# Patient Record
Sex: Female | Born: 1965 | Race: Black or African American | Hispanic: No | Marital: Single | Smoking: Never smoker
Health system: Southern US, Community
[De-identification: ages and names within clinical notes are randomized; demographics above are authoritative.]

## PROBLEM LIST (undated history)

## (undated) DIAGNOSIS — I10 Essential (primary) hypertension: Secondary | ICD-10-CM

## (undated) DIAGNOSIS — E119 Type 2 diabetes mellitus without complications: Secondary | ICD-10-CM

## (undated) DIAGNOSIS — K219 Gastro-esophageal reflux disease without esophagitis: Secondary | ICD-10-CM

## (undated) DIAGNOSIS — G4730 Sleep apnea, unspecified: Secondary | ICD-10-CM

## (undated) HISTORY — DX: Sleep apnea, unspecified: G47.30

## (undated) HISTORY — PX: WISDOM TOOTH EXTRACTION: SHX21

## (undated) HISTORY — DX: Gastro-esophageal reflux disease without esophagitis: K21.9

---

## 1994-02-06 HISTORY — PX: TUBAL LIGATION: SHX77

## 1997-06-03 ENCOUNTER — Other Ambulatory Visit: Admission: RE | Admit: 1997-06-03 | Discharge: 1997-06-03 | Payer: Self-pay | Admitting: Obstetrics

## 1999-06-07 ENCOUNTER — Other Ambulatory Visit: Admission: RE | Admit: 1999-06-07 | Discharge: 1999-06-07 | Payer: Self-pay | Admitting: Family Medicine

## 1999-06-07 ENCOUNTER — Ambulatory Visit (HOSPITAL_COMMUNITY): Admission: RE | Admit: 1999-06-07 | Discharge: 1999-06-07 | Payer: Self-pay | Admitting: Family Medicine

## 1999-12-17 ENCOUNTER — Emergency Department (HOSPITAL_COMMUNITY): Admission: EM | Admit: 1999-12-17 | Discharge: 1999-12-17 | Payer: Self-pay | Admitting: *Deleted

## 2000-03-02 ENCOUNTER — Encounter: Payer: Self-pay | Admitting: Emergency Medicine

## 2000-03-02 ENCOUNTER — Emergency Department (HOSPITAL_COMMUNITY): Admission: EM | Admit: 2000-03-02 | Discharge: 2000-03-02 | Payer: Self-pay | Admitting: Emergency Medicine

## 2002-02-26 ENCOUNTER — Other Ambulatory Visit: Admission: RE | Admit: 2002-02-26 | Discharge: 2002-02-26 | Payer: Self-pay | Admitting: Family Medicine

## 2003-08-25 ENCOUNTER — Emergency Department (HOSPITAL_COMMUNITY): Admission: EM | Admit: 2003-08-25 | Discharge: 2003-08-26 | Payer: Self-pay | Admitting: Emergency Medicine

## 2003-08-31 ENCOUNTER — Emergency Department (HOSPITAL_COMMUNITY): Admission: EM | Admit: 2003-08-31 | Discharge: 2003-08-31 | Payer: Self-pay | Admitting: Family Medicine

## 2005-09-06 ENCOUNTER — Emergency Department (HOSPITAL_COMMUNITY): Admission: EM | Admit: 2005-09-06 | Discharge: 2005-09-06 | Payer: Self-pay | Admitting: Family Medicine

## 2006-08-29 ENCOUNTER — Other Ambulatory Visit: Admission: RE | Admit: 2006-08-29 | Discharge: 2006-08-29 | Payer: Self-pay | Admitting: Family Medicine

## 2006-09-12 ENCOUNTER — Encounter: Admission: RE | Admit: 2006-09-12 | Discharge: 2006-09-12 | Payer: Self-pay | Admitting: Family Medicine

## 2007-02-03 ENCOUNTER — Emergency Department (HOSPITAL_COMMUNITY): Admission: EM | Admit: 2007-02-03 | Discharge: 2007-02-03 | Payer: Self-pay | Admitting: Emergency Medicine

## 2007-02-04 ENCOUNTER — Encounter (INDEPENDENT_AMBULATORY_CARE_PROVIDER_SITE_OTHER): Payer: Self-pay | Admitting: Emergency Medicine

## 2007-02-04 ENCOUNTER — Ambulatory Visit: Payer: Self-pay | Admitting: Vascular Surgery

## 2007-02-04 ENCOUNTER — Ambulatory Visit (HOSPITAL_COMMUNITY): Admission: RE | Admit: 2007-02-04 | Discharge: 2007-02-04 | Payer: Self-pay | Admitting: Emergency Medicine

## 2007-09-11 ENCOUNTER — Emergency Department (HOSPITAL_COMMUNITY): Admission: EM | Admit: 2007-09-11 | Discharge: 2007-09-11 | Payer: Self-pay | Admitting: Emergency Medicine

## 2010-11-04 LAB — CULTURE, ROUTINE-ABSCESS

## 2010-11-11 LAB — D-DIMER, QUANTITATIVE: D-Dimer, Quant: 1.57 — ABNORMAL HIGH

## 2012-10-24 ENCOUNTER — Encounter (HOSPITAL_COMMUNITY): Payer: Self-pay | Admitting: *Deleted

## 2012-10-24 ENCOUNTER — Emergency Department (INDEPENDENT_AMBULATORY_CARE_PROVIDER_SITE_OTHER)
Admission: EM | Admit: 2012-10-24 | Discharge: 2012-10-24 | Disposition: A | Discharge status: Home / Self Care | Payer: BC Managed Care – PPO | Source: Home / Self Care | Attending: Emergency Medicine | Admitting: Emergency Medicine

## 2012-10-24 DIAGNOSIS — M436 Torticollis: Secondary | ICD-10-CM

## 2012-10-24 MED ORDER — MELOXICAM 15 MG PO TABS: 15.0000 mg | ORAL_TABLET | Freq: Every day | ORAL | Status: DC

## 2012-10-24 MED ORDER — CYCLOBENZAPRINE HCL 5 MG PO TABS: 5.0000 mg | ORAL_TABLET | Freq: Three times a day (TID) | ORAL | Status: DC | PRN

## 2012-10-24 NOTE — ED Notes (Signed)
Pt  Reports she woke  Up  sev  Days  Ago  With a  painfull  Stiff  Neck  -  She  denys  Any specefic injury  She  Attributes  It to  Sleeping on it  Wrong           She  ambulated to  Room with a  Steady   Fluid  Gait       Sitting upright on  Exam table  Speaking in  Complete  sentances

## 2012-10-24 NOTE — ED Provider Notes (Signed)
Chief Complaint:   Chief Complaint  Patient presents with  . Torticollis    History of Present Illness:   Laurie Buckley is a 47 year old female who woke up Monday morning, 4 days ago, with pain in the left side of her neck along the trapezius, radiating into her upper back and towards her shoulder but not down her arm. The pain was worse with lateral bending or rotation of the head to the right but not to the left. She denies any radiation down the arm, numbness, tingling, weakness, or arm swelling. She's had no fever, headache, neck mass, or adenopathy. She denies any trauma to the neck in the past. She has had no chest pain or shortness of breath.  Review of Systems:  Other than noted above, the patient denies any of the following symptoms: Constitutional:  No fever, chills, or sweats. ENT:  No nasal congestion, sore throat, or oral ulcerations or lesions. Neck:  No swelling, or adenopathy.  Full ROM without pain. Cardiac:  No chest pain, tightness, or pressure. Respiratory:  No cough, wheezing, or dyspnea. M-S:  No joint pain, muscle pain, or back problems. Neuro:  No muscle weakness, numbness or paresthesias.  PMFSH:  Past medical history, family history, social history, meds, and allergies were reviewed.  She has high blood pressure and takes lisinopril.  Physical Exam:   Vital signs:  BP 152/100  Pulse 80  Temp(Src) 98.6 F (37 C) (Oral)  Resp 18  SpO2 98%  LMP 09/06/2012 General:  Alert, oriented and in no distress. Eye:  PERRL, full EOMs. ENT:  Pharynx clear, no oral lesions. Neck:  She has pain to palpation over her left trapezius ridge extending into her upper back area. There was no adenopathy or mass. The neck has a limited range of motion with decrease in right lateral bending and right rotation with pain. She has a fairly good range of motion to the left. The shoulder was nontender and have full range of motion. Lungs:  No respiratory distress.  Breath sounds clear and  equal bilaterally.  No wheezes, rales or rhonchi. Heart:  Regular rhythm.  No gallops, murmers, or rubs. Ext:  No upper extremity edema, pulses full.  Full ROM of joints with no joint or muscle pain to palpation. Neuro:  Alert and oriented times 3.  No focal muscle weakness.  DTRs symmetric.  Sensation intact to light touch. Skin: Clear, warm and dry.  No rash.  Good capillary refill.  Assessment:  The encounter diagnosis was Torticollis.  No evidence of cervical radiculopathy. Patient advised that should clear up within the next week to 10 days, if not return here for recheck. She was advised of her elevated blood pressure and suggested she return to her primary care physician for recheck on this.  Plan:   1.  Meds:  The following meds were prescribed:   Discharge Medication List as of 10/24/2012  8:46 AM    START taking these medications   Details  cyclobenzaprine (FLEXERIL) 5 MG tablet Take 1 tablet (5 mg total) by mouth 3 (three) times daily as needed for muscle spasms., Starting 10/24/2012, Until Discontinued, Normal    meloxicam (MOBIC) 15 MG tablet Take 1 tablet (15 mg total) by mouth daily., Starting 10/24/2012, Until Discontinued, Normal        2.  Patient Education/Counseling:  The patient was given appropriate handouts, self care instructions, and instructed in symptomatic relief.  Given neck exercises to do.  3.  Follow up:  The patient was told to follow up if no better in 3 to 4 days, if becoming worse in any way, and given some red flag symptoms such as worsening pain or new neurological symptoms which would prompt immediate return.  Follow up here if necessary.     Reuben Likes, MD 10/24/12 [phone]

## 2012-11-07 ENCOUNTER — Other Ambulatory Visit: Payer: Self-pay

## 2013-01-24 ENCOUNTER — Other Ambulatory Visit: Payer: Self-pay

## 2013-01-24 DIAGNOSIS — Z1231 Encounter for screening mammogram for malignant neoplasm of breast: Secondary | ICD-10-CM

## 2013-01-28 ENCOUNTER — Ambulatory Visit
Admission: RE | Admit: 2013-01-28 | Discharge: 2013-01-28 | Disposition: A | Discharge status: Home / Self Care | Payer: BC Managed Care – PPO | Source: Ambulatory Visit

## 2013-01-28 DIAGNOSIS — Z1231 Encounter for screening mammogram for malignant neoplasm of breast: Secondary | ICD-10-CM

## 2014-02-23 ENCOUNTER — Encounter (HOSPITAL_COMMUNITY): Payer: Self-pay

## 2014-02-23 ENCOUNTER — Emergency Department (HOSPITAL_COMMUNITY)
Admission: EM | Admit: 2014-02-23 | Discharge: 2014-02-23 | Disposition: A | Discharge status: Home / Self Care | Payer: Medicaid Other | Source: Home / Self Care | Attending: Emergency Medicine | Admitting: Emergency Medicine

## 2014-02-23 DIAGNOSIS — H6092 Unspecified otitis externa, left ear: Secondary | ICD-10-CM

## 2014-02-23 MED ORDER — CIPROFLOXACIN-DEXAMETHASONE 0.3-0.1 % OT SUSP
4.0000 [drp] | Freq: Two times a day (BID) | OTIC | Status: DC
Start: 2014-02-23 — End: 2020-03-10

## 2014-02-23 NOTE — ED Provider Notes (Signed)
CSN: [id number]     Arrival date & time 02/23/14  1455 History   First MD Initiated Contact with Patient 02/23/14 1502     Chief Complaint  Patient presents with  . Otalgia   (Consider location/radiation/quality/duration/timing/severity/associated sxs/prior Treatment) HPI  She is a 49 year old woman here for evaluation of left ear pain. This started about 4 days ago. It is described as a sharp ear pain. It is not associated with jaw movements. No decrease in hearing. She does state the ear feels congested. She has tried white vinegar without improvement. She also took some of her daughter's amoxicillin from an old prescription without improvement.  No fevers or chills. No nasal congestion or rhinorrhea.  History reviewed. No pertinent past medical history. History reviewed. No pertinent past surgical history. History reviewed. No pertinent family history. History  Substance Use Topics  . Smoking status: Never Smoker   . Smokeless tobacco: Not on file  . Alcohol Use: Not on file   OB History    No data available     Review of Systems As in history of present illness Allergies  Review of patient's allergies indicates no known allergies.  Home Medications   Prior to Admission medications   Medication Sig Start Date End Date Taking? Authorizing Provider  ciprofloxacin-dexamethasone (CIPRODEX) otic suspension Place 4 drops into the left ear 2 (two) times daily. 02/23/14   Melony Overly, MD  cyclobenzaprine (FLEXERIL) 5 MG tablet Take 1 tablet (5 mg total) by mouth 3 (three) times daily as needed for muscle spasms. 10/24/12   Harden Mo, MD  meloxicam (MOBIC) 15 MG tablet Take 1 tablet (15 mg total) by mouth daily. 10/24/12   Harden Mo, MD   BP 158/99 mmHg  Pulse 109  Temp(Src) 98.2 F (36.8 C) (Oral)  Resp 16  SpO2 99% Physical Exam  Constitutional: She is oriented to person, place, and time. She appears well-developed and well-nourished. No distress.  HENT:  Right Ear:  Tympanic membrane, external ear and ear canal normal.  Left Ear: Tympanic membrane normal. There is tenderness (and erythema of ear canal).  Mouth/Throat: Oropharynx is clear and moist. No oropharyngeal exudate.  Cardiovascular: Normal rate.   Pulmonary/Chest: Effort normal.  Neurological: She is alert and oriented to person, place, and time.    ED Course  Procedures (including critical care time) Labs Review Labs Reviewed - No data to display  Imaging Review No results found.   MDM   1. Left otitis externa    We'll treat with Ciprodex for 1 week. Follow-up as needed.    Melony Overly, MD 02/23/14 [phone]

## 2014-02-23 NOTE — Discharge Instructions (Signed)
You have some irritation of the ear canal. Use the ear drops twice a day for 7 days. You can take ibuprofen 600mg  4 times a day as needed for pain.  Follow up if no improvement in 3 days.

## 2014-02-23 NOTE — ED Notes (Signed)
C/o pain in her left ear x couple of days. Unable to get relief w home remedies , used left over amoxicillin from her daughters previous ear infection

## 2014-03-27 ENCOUNTER — Other Ambulatory Visit: Payer: Self-pay

## 2014-03-27 DIAGNOSIS — Z1231 Encounter for screening mammogram for malignant neoplasm of breast: Secondary | ICD-10-CM

## 2014-03-31 ENCOUNTER — Other Ambulatory Visit: Payer: Self-pay

## 2014-03-31 ENCOUNTER — Ambulatory Visit
Admission: RE | Admit: 2014-03-31 | Discharge: 2014-03-31 | Disposition: A | Discharge status: Home / Self Care | Payer: Medicaid Other | Source: Ambulatory Visit

## 2014-03-31 DIAGNOSIS — Z1231 Encounter for screening mammogram for malignant neoplasm of breast: Secondary | ICD-10-CM

## 2014-05-08 ENCOUNTER — Ambulatory Visit (HOSPITAL_BASED_OUTPATIENT_CLINIC_OR_DEPARTMENT_OTHER): Payer: 59 | Attending: Internal Medicine

## 2014-05-08 VITALS — Ht 64.0 in | Wt 240.0 lb

## 2014-05-08 DIAGNOSIS — R0683 Snoring: Secondary | ICD-10-CM | POA: Diagnosis not present

## 2014-05-08 DIAGNOSIS — G4710 Hypersomnia, unspecified: Secondary | ICD-10-CM | POA: Diagnosis present

## 2014-05-08 DIAGNOSIS — G4733 Obstructive sleep apnea (adult) (pediatric): Secondary | ICD-10-CM | POA: Insufficient documentation

## 2014-05-08 DIAGNOSIS — G4730 Sleep apnea, unspecified: Secondary | ICD-10-CM

## 2014-05-10 ENCOUNTER — Ambulatory Visit (HOSPITAL_BASED_OUTPATIENT_CLINIC_OR_DEPARTMENT_OTHER): Payer: 59 | Admitting: Internal Medicine

## 2014-05-10 DIAGNOSIS — G4733 Obstructive sleep apnea (adult) (pediatric): Secondary | ICD-10-CM

## 2014-05-10 NOTE — Sleep Study (Signed)
   NAME: Laurie Buckley DATE OF BIRTH:  1965/12/15 MEDICAL RECORD NUMBER [id number]  LOCATION: [facility]  PHYSICIAN: Naria Abbey D  DATE OF STUDY: 05/08/2014  SLEEP STUDY TYPE: Nocturnal Polysomnogram               REFERRING PHYSICIAN: Nolene Ebbs, MD  INDICATION FOR STUDY: Hypersomnia with sleep apnea  EPWORTH SLEEPINESS SCORE:   4/24 HEIGHT: 5\' 4"  (162.6 cm)  WEIGHT: 108.863 kg (240 lb)    Body mass index is 41.18 kg/(m^2).  NECK SIZE: 15 in.  MEDICATIONS: Charted for review  SLEEP ARCHITECTURE: Split study protocol. During the diagnostic phase, total sleep time 169.5 minutes with sleep efficiency 92.9%. Stage I was 2.9%, stage II 88.5%, stage III 1.5%, REM 7.1% of total sleep time. Sleep latency 10.5 minutes, REM latency 114.5 minutes, awake after sleep onset 3.5 minutes, arousal index 2.5, bedtime medication: None  RESPIRATORY DATA: Apnea hypopnea index (AHI) 55.2 per hour. 156 total events scored including 73 obstructive apneas and 83 hypopneas. Events were while supine. REM AHI 120 per hour. CPAP was titrated to 14 CWP, AHI 0.7 per hour. She wore a medium fullface mask.  OXYGEN DATA: Loud snoring before CPAP with oxygen desaturation to a nadir of 73% on room air. With CPAP control, snoring was prevented and mean oxygen saturation was 95.1%.  CARDIAC DATA: Sinus rhythm with PVCs  MOVEMENT/PARASOMNIA: No significant movement disturbance, bathroom 2  IMPRESSION/ RECOMMENDATION:   1) Severe obstructive sleep apnea/hypopnea syndrome, AHI 55.2 per hour with supine events. Loud snoring with oxygen desaturation to a nadir of 73% on room air. 2) Successful CPAP titration to 14 CWP, AHI 0.7 per hour. She wore a medium F&P Simplus fullface mask with heated humidifier. Snoring was prevented and mean oxygen saturation was 95.1%   Deneise Lever Diplomate, American Board of Sleep Medicine  ELECTRONICALLY SIGNED ON:  05/10/2014, 2:59 PM [place] PH: [phone]   FX: [phone] [place]

## 2014-06-10 ENCOUNTER — Encounter (HOSPITAL_BASED_OUTPATIENT_CLINIC_OR_DEPARTMENT_OTHER): Payer: Medicaid Other

## 2015-04-21 ENCOUNTER — Emergency Department (HOSPITAL_COMMUNITY)
Admission: EM | Admit: 2015-04-21 | Discharge: 2015-04-21 | Disposition: A | Discharge status: Home / Self Care | Payer: BLUE CROSS/BLUE SHIELD | Source: Home / Self Care | Attending: Family Medicine | Admitting: Family Medicine

## 2015-04-21 ENCOUNTER — Emergency Department (INDEPENDENT_AMBULATORY_CARE_PROVIDER_SITE_OTHER): Payer: BLUE CROSS/BLUE SHIELD

## 2015-04-21 ENCOUNTER — Encounter (HOSPITAL_COMMUNITY): Payer: Self-pay | Admitting: *Deleted

## 2015-04-21 DIAGNOSIS — J209 Acute bronchitis, unspecified: Secondary | ICD-10-CM | POA: Diagnosis not present

## 2015-04-21 HISTORY — DX: Essential (primary) hypertension: I10

## 2015-04-21 MED ORDER — METHYLPREDNISOLONE ACETATE 40 MG/ML IJ SUSP
80.0000 mg | Freq: Once | INTRAMUSCULAR | Status: AC
  Administered 2015-04-21: 80 mg via INTRAMUSCULAR

## 2015-04-21 MED ORDER — TRIAMCINOLONE ACETONIDE 40 MG/ML IJ SUSP
INTRAMUSCULAR | Status: AC
  Filled 2015-04-21: qty 1

## 2015-04-21 MED ORDER — ACETAMINOPHEN 325 MG PO TABS
650.0000 mg | ORAL_TABLET | Freq: Once | ORAL | Status: AC
  Administered 2015-04-21: 650 mg via ORAL

## 2015-04-21 MED ORDER — TRIAMCINOLONE ACETONIDE 40 MG/ML IJ SUSP
40.0000 mg | Freq: Once | INTRAMUSCULAR | Status: AC
  Administered 2015-04-21: 40 mg via INTRAMUSCULAR

## 2015-04-21 MED ORDER — AZITHROMYCIN 250 MG PO TABS: ORAL_TABLET | ORAL | Status: DC

## 2015-04-21 MED ORDER — ACETAMINOPHEN 325 MG PO TABS
ORAL_TABLET | ORAL | Status: AC
  Filled 2015-04-21: qty 2

## 2015-04-21 MED ORDER — METHYLPREDNISOLONE ACETATE 80 MG/ML IJ SUSP
INTRAMUSCULAR | Status: AC
  Filled 2015-04-21: qty 1

## 2015-04-21 NOTE — Discharge Instructions (Signed)
Take all of medicine, drink lots of fluids, see your doctor if further problems °

## 2015-04-21 NOTE — ED Notes (Signed)
Pt     Reports symptoms  Of    Cough   Congested      Chills    Body aches  And  Fever     With  Onset  of  Symptoms        X   2  Days    daughter  Hs  Similar   Symptoms  As  Well

## 2015-04-21 NOTE — ED Provider Notes (Signed)
CSN: [id number]     Arrival date & time 04/21/15  1602 History   First MD Initiated Contact with Patient 04/21/15 1703     Chief Complaint  Patient presents with  . URI   (Consider location/radiation/quality/duration/timing/severity/associated sxs/prior Treatment) Patient is a 50 y.o. female presenting with URI. The history is provided by the patient.  URI Presenting symptoms: congestion, cough, fatigue, fever and rhinorrhea   Severity:  Moderate Onset quality:  Sudden Duration:  2 days Chronicity:  New Ineffective treatments:  OTC medications Associated symptoms: myalgias and wheezing   Risk factors: sick contacts   Risk factors comment:  Daughter also sick.   Past Medical History  Diagnosis Date  . Hypertension    History reviewed. No pertinent past surgical history. History reviewed. No pertinent family history. Social History  Substance Use Topics  . Smoking status: Never Smoker   . Smokeless tobacco: None  . Alcohol Use: No   OB History    No data available     Review of Systems  Constitutional: Positive for fever and fatigue.  HENT: Positive for congestion, postnasal drip and rhinorrhea.   Respiratory: Positive for cough and wheezing. Negative for shortness of breath.   Gastrointestinal: Negative.   Genitourinary: Negative.   Musculoskeletal: Positive for myalgias.  All other systems reviewed and are negative.   Allergies  Review of patient's allergies indicates no known allergies.  Home Medications   Prior to Admission medications   Medication Sig Start Date End Date Taking? Authorizing Provider  LISINOPRIL PO Take by mouth.   Yes Historical Provider, MD  ciprofloxacin-dexamethasone (CIPRODEX) otic suspension Place 4 drops into the left ear 2 (two) times daily. 02/23/14   Melony Overly, MD  cyclobenzaprine (FLEXERIL) 5 MG tablet Take 1 tablet (5 mg total) by mouth 3 (three) times daily as needed for muscle spasms. 10/24/12   Harden Mo, MD  meloxicam  (MOBIC) 15 MG tablet Take 1 tablet (15 mg total) by mouth daily. 10/24/12   Harden Mo, MD   Meds Ordered and Administered this Visit  Medications - No data to display  BP 137/80 mmHg  Pulse 87  Temp(Src) 102.1 F (38.9 C) (Oral)  Resp 16  SpO2 96%  LMP 04/21/2015 No data found.   Physical Exam  Constitutional: She is oriented to person, place, and time. She appears well-developed and well-nourished. No distress.  HENT:  Right Ear: External ear normal.  Left Ear: External ear normal.  Mouth/Throat: Oropharynx is clear and moist.  Neck: Normal range of motion. Neck supple.  Cardiovascular: Normal rate, regular rhythm, normal heart sounds and intact distal pulses.   Pulmonary/Chest: Effort normal.  Abdominal: Soft. Bowel sounds are normal. There is no tenderness.  Lymphadenopathy:    She has no cervical adenopathy.  Neurological: She is alert and oriented to person, place, and time.  Skin: Skin is warm and dry.  Nursing note and vitals reviewed.   ED Course  Procedures (including critical care time)  Labs Review Labs Reviewed - No data to display  Imaging Review No results found. Meds ordered this encounter  Medications  . LISINOPRIL PO    Sig: Take by mouth.  Marland Kitchen acetaminophen (TYLENOL) tablet 650 mg    Sig:      Visual Acuity Review  Right Eye Distance:   Left Eye Distance:   Bilateral Distance:    Right Eye Near:   Left Eye Near:    Bilateral Near:  MDM  No diagnosis found. Meds ordered this encounter  Medications  . LISINOPRIL PO    Sig: Take by mouth.  Marland Kitchen acetaminophen (TYLENOL) tablet 650 mg    Sig:   . azithromycin (ZITHROMAX Z-PAK) 250 MG tablet    Sig: Take as directed on pack    Dispense:  6 tablet    Refill:  0  . triamcinolone acetonide (KENALOG-40) injection 40 mg    Sig:   . methylPREDNISolone acetate (DEPO-MEDROL) injection 80 mg    Sig:       Billy Fischer, MD 04/21/15 1850

## 2020-03-10 ENCOUNTER — Ambulatory Visit
Admission: EM | Admit: 2020-03-10 | Discharge: 2020-03-10 | Disposition: A | Discharge status: Home / Self Care | Payer: 59 | Attending: Urgent Care | Admitting: Urgent Care

## 2020-03-10 ENCOUNTER — Other Ambulatory Visit: Payer: Self-pay

## 2020-03-10 DIAGNOSIS — R350 Frequency of micturition: Secondary | ICD-10-CM | POA: Insufficient documentation

## 2020-03-10 DIAGNOSIS — N898 Other specified noninflammatory disorders of vagina: Secondary | ICD-10-CM | POA: Insufficient documentation

## 2020-03-10 DIAGNOSIS — B373 Candidiasis of vulva and vagina: Secondary | ICD-10-CM | POA: Diagnosis present

## 2020-03-10 DIAGNOSIS — B3731 Acute candidiasis of vulva and vagina: Secondary | ICD-10-CM

## 2020-03-10 DIAGNOSIS — R3989 Other symptoms and signs involving the genitourinary system: Secondary | ICD-10-CM | POA: Diagnosis present

## 2020-03-10 LAB — POCT URINALYSIS DIP (MANUAL ENTRY)
Bilirubin, UA: NEGATIVE
Blood, UA: NEGATIVE
Glucose, UA: NEGATIVE mg/dL
Nitrite, UA: NEGATIVE
Protein Ur, POC: 100 mg/dL — AB
Spec Grav, UA: 1.025 (ref 1.010–1.025)
Urobilinogen, UA: 0.2 E.U./dL — AB
pH, UA: 7 (ref 5.0–8.0)

## 2020-03-10 MED ORDER — FLUCONAZOLE 150 MG PO TABS: 150.0000 mg | ORAL_TABLET | ORAL | 0 refills | Status: DC

## 2020-03-10 NOTE — ED Provider Notes (Signed)
[place]   MRN: [id number] DOB: 16-Aug-1965  Subjective:   Laurie Buckley is a 55 y.o. female presenting for 1 day history of acute onset vaginal itching, irritation of the genital area.  She was having bladder pressure, urinary frequency in the past 2 weeks.  She actually tested positive for COVID-19 and underwent an antibiotic course and prednisone course which she completed.  Denies any active dysuria, fever, nausea, vomiting, flank pain.  Admits that she has not been drinking water, has been doing Powerade and Gatorade.  Denies history of kidney stones.  Patient has low concern for STIs but is not opposed to testing.  No current facility-administered medications for this encounter. No current outpatient medications on file.   No Known Allergies  Past Medical History:  Diagnosis Date  . Hypertension      History reviewed. No pertinent surgical history.  History reviewed. No pertinent family history.  Social History   Tobacco Use  . Smoking status: Never Smoker  . Smokeless tobacco: Never Used  Vaping Use  . Vaping Use: Never used  Substance Use Topics  . Alcohol use: No    ROS   Objective:   Vitals: BP (!) 159/102 (BP Location: Right Arm)   Pulse 86   Temp 98.2 F (36.8 C) (Oral)   Resp 18   SpO2 97%   BP Readings from Last 3 Encounters:  03/10/20 (!) 159/102  04/21/15 137/80  02/23/14 158/99   Physical Exam Constitutional:      General: She is not in acute distress.    Appearance: Normal appearance. She is well-developed and normal weight. She is not ill-appearing, toxic-appearing or diaphoretic.  HENT:     Head: Normocephalic and atraumatic.     Right Ear: External ear normal.     Left Ear: External ear normal.     Nose: Nose normal.     Mouth/Throat:     Mouth: Mucous membranes are moist.     Pharynx: Oropharynx is clear.  Eyes:     General: No scleral icterus.    Extraocular Movements: Extraocular movements intact.     Pupils:  Pupils are equal, round, and reactive to light.  Cardiovascular:     Rate and Rhythm: Normal rate and regular rhythm.     Heart sounds: Normal heart sounds. No murmur heard. No friction rub. No gallop.   Pulmonary:     Effort: Pulmonary effort is normal. No respiratory distress.     Breath sounds: Normal breath sounds. No stridor. No wheezing, rhonchi or rales.  Abdominal:     General: Bowel sounds are normal. There is no distension.     Palpations: Abdomen is soft. There is no mass.     Tenderness: There is no abdominal tenderness. There is no right CVA tenderness, left CVA tenderness, guarding or rebound.  Skin:    General: Skin is warm and dry.     Coloration: Skin is not pale.     Findings: No rash.  Neurological:     General: No focal deficit present.     Mental Status: She is alert and oriented to person, place, and time.  Psychiatric:        Mood and Affect: Mood normal.        Behavior: Behavior normal.        Thought Content: Thought content normal.        Judgment: Judgment normal.     Results for orders placed or performed during the hospital encounter  of 03/10/20 (from the past 24 hour(s))  POCT urinalysis dipstick     Status: Abnormal   Collection Time: 03/10/20  1:15 PM  Result Value Ref Range   Color, UA yellow yellow   Clarity, UA hazy (A) clear   Glucose, UA negative negative mg/dL   Bilirubin, UA negative negative   Ketones, POC UA trace (5) (A) negative mg/dL   Spec Grav, UA 1.025 1.010 - 1.025   Blood, UA negative negative   pH, UA 7.0 5.0 - 8.0   Protein Ur, POC =100 (A) negative mg/dL   Urobilinogen, UA 0.2 (A) 0.2 or 1.0 E.U./dL   Nitrite, UA Negative Negative   Leukocytes, UA Trace (A) Negative    Assessment and Plan :   PDMP not reviewed this encounter.  1. Yeast vaginitis   2. Vaginal itching   3. Sensation of pressure in bladder area   4. Urinary frequency     Will cover for yeast vaginitis with Diflucan.  Labs pending.  Recommended  avoiding urinary irritants, hydrating with plain water. Counseled patient on potential for adverse effects with medications prescribed/recommended today, ER and return-to-clinic precautions discussed, patient verbalized understanding.    Jaynee Eagles, [place] 03/10/20 6144

## 2020-03-10 NOTE — ED Triage Notes (Signed)
Patient states she has had bladder pressure and urinary frequency since last night. Pt is also concerned she has had a recent yeast infection and would like it to be checked.

## 2020-03-11 LAB — CERVICOVAGINAL ANCILLARY ONLY
Bacterial Vaginitis (gardnerella): POSITIVE — AB
Candida Glabrata: NEGATIVE
Candida Vaginitis: NEGATIVE
Chlamydia: NEGATIVE
Comment: NEGATIVE
Comment: NEGATIVE
Comment: NEGATIVE
Comment: NEGATIVE
Comment: NEGATIVE
Comment: NORMAL
Neisseria Gonorrhea: NEGATIVE
Trichomonas: NEGATIVE

## 2020-03-11 LAB — URINE CULTURE: Culture: NO GROWTH

## 2020-05-14 ENCOUNTER — Ambulatory Visit (INDEPENDENT_AMBULATORY_CARE_PROVIDER_SITE_OTHER): Payer: 59 | Admitting: Primary Care

## 2020-05-14 ENCOUNTER — Other Ambulatory Visit: Payer: Self-pay

## 2020-05-14 VITALS — BP 172/110 | HR 87 | Temp 97.3°F | Ht 64.0 in | Wt 269.4 lb

## 2020-05-14 DIAGNOSIS — Z23 Encounter for immunization: Secondary | ICD-10-CM | POA: Diagnosis not present

## 2020-05-14 DIAGNOSIS — Z131 Encounter for screening for diabetes mellitus: Secondary | ICD-10-CM | POA: Diagnosis not present

## 2020-05-14 DIAGNOSIS — R7303 Prediabetes: Secondary | ICD-10-CM

## 2020-05-14 DIAGNOSIS — I10 Essential (primary) hypertension: Secondary | ICD-10-CM

## 2020-05-14 DIAGNOSIS — Z7689 Persons encountering health services in other specified circumstances: Secondary | ICD-10-CM

## 2020-05-14 DIAGNOSIS — M2550 Pain in unspecified joint: Secondary | ICD-10-CM

## 2020-05-14 DIAGNOSIS — K219 Gastro-esophageal reflux disease without esophagitis: Secondary | ICD-10-CM

## 2020-05-14 LAB — POCT GLYCOSYLATED HEMOGLOBIN (HGB A1C): Hemoglobin A1C: 6.1 % — AB (ref 4.0–5.6)

## 2020-05-14 MED ORDER — AMLODIPINE BESYLATE 10 MG PO TABS: 10.0000 mg | ORAL_TABLET | Freq: Every day | ORAL | 3 refills | Status: DC

## 2020-05-14 MED ORDER — CLONIDINE HCL 0.1 MG PO TABS
0.2000 mg | ORAL_TABLET | Freq: Once | ORAL | Status: AC
  Administered 2020-05-14: 0.2 mg via ORAL

## 2020-05-14 MED ORDER — LOSARTAN POTASSIUM-HCTZ 100-25 MG PO TABS: 1.0000 | ORAL_TABLET | Freq: Every day | ORAL | 3 refills | Status: DC

## 2020-05-14 NOTE — Patient Instructions (Signed)

## 2020-05-14 NOTE — Progress Notes (Signed)
New Patient Office Visit  Subjective:  Patient ID: Laurie Buckley, female    DOB: [birth year]  Age: 55 y.o. MRN: [id number]  CC:  Chief Complaint  Patient presents with  . New Patient (Initial Visit)    OSA- diagnosed in 2016 does have CPAP wants to know if she needs another sleep study     HPI [name] 55 year old morbid abuse presents for establish care. Her main concern if she needs a new CPAP  dx with OSA in 2016 and been on the same setting and machine. Continues to snore but does not have apnea.   Past Medical History:  Diagnosis Date  . Hypertension     No past surgical history on file.  No family history on file.  Social History   Socioeconomic History  . Marital status: Single    Spouse name: Not on file  . Number of children: Not on file  . Years of education: Not on file  . Highest education level: Not on file  Occupational History  . Not on file  Tobacco Use  . Smoking status: Never Smoker  . Smokeless tobacco: Never Used  Vaping Use  . Vaping Use: Never used  Substance and Sexual Activity  . Alcohol use: No  . Drug use: Not on file  . Sexual activity: Yes  Other Topics Concern  . Not on file  Social History Narrative  . Not on file   Social Determinants of Health   Financial Resource Strain: Not on file  Food Insecurity: Not on file  Transportation Needs: Not on file  Physical Activity: Not on file  Stress: Not on file  Social Connections: Not on file  Intimate Partner Violence: Not on file    ROS Review of Systems  Cardiovascular:       Chest tightness no radiating features to neck , arm or back   Gastrointestinal: Positive for abdominal distention.       GERD  Genitourinary: Positive for frequency.  Musculoskeletal: Positive for arthralgias.  All other systems reviewed and are negative.   Objective:   Today's Vitals: BP (!) 172/110 (BP Location: Right Arm, Patient Position: Sitting, Cuff Size: Large)   Pulse 87   Temp  (!) 97.3 F (36.3 C) (Temporal)   Ht _0  (1.626 m)   Wt 269 lb 6.4 oz (122.2 kg)   BMI 46.24 kg/m   Physical Exam Vitals reviewed.  Constitutional:      Appearance: She is obese.     Comments: Morbid   HENT:     Head: Normocephalic.     Right Ear: Tympanic membrane and external ear normal.     Left Ear: Tympanic membrane and external ear normal.     Nose: Nose normal.  Eyes:     Extraocular Movements: Extraocular movements intact.     Pupils: Pupils are equal, round, and reactive to light.  Cardiovascular:     Rate and Rhythm: Normal rate and regular rhythm.  Pulmonary:     Effort: Pulmonary effort is normal.     Breath sounds: Normal breath sounds.  Abdominal:     General: Bowel sounds are normal. There is distension.     Palpations: Abdomen is soft.  Musculoskeletal:        General: Normal range of motion.     Cervical back: Normal range of motion and neck supple.  Skin:    General: Skin is warm and dry.  Neurological:  Mental Status: She is oriented to person, place, and time.  Psychiatric:        Mood and Affect: Mood normal.        Behavior: Behavior normal.        Thought Content: Thought content normal.        Judgment: Judgment normal.     Assessment & Plan:  Laurie Buckley was seen today for new patient (initial visit).  Diagnoses and all orders for this visit:  Screening for diabetes mellitus -     HgB A1c 6.1  Need for Tdap vaccination -     Tdap vaccine greater than or equal to 7yo IM  Essential hypertension Counseled on blood pressure goal of less than 130/80, low-sodium, DASH diet, medication compliance, 150 minutes of moderate intensity exercise per week. Discussed medication compliance, adverse effects. -     cloNIDine (CATAPRES) tablet 0.2 mg -     CBC with Differential -     CMP14+EGFR -     amLODipine (NORVASC) 10 MG tablet; Take 1 tablet (10 mg total) by mouth daily. -     losartan-hydrochlorothiazide (HYZAAR) 100-25 MG tablet; Take 1  tablet by mouth daily.  Encounter to establish care Establish care  Gastroesophageal reflux disease without esophagitis Discussed eating small frequent meal, reduction in acidic foods, fried foods ,spicy foods, alcohol caffeine and tobacco and certain medications. Avoid laying down after eating 61mns-1hour, elevated head of the bed.   Morbid obesity ([place] Morbid Obesity is > 40  indicating an excess in caloric intake or underlining conditions. This may lead to other co-morbidities. Lifestyle modifications of diet and exercise may reduce obesity. Risk factor T2D. Has respiratory problems and HTN -     Lipid Panel  Prediabetes A1C 6.0 counseled on diet decreasing carbohydrates such as rice, potatoes, breads, sweets, sugars, sodas, moderation and increase in exercise will reevaluate A1c in 6 to 12 months.  Outpatient Encounter Medications as of 05/14/2020  Medication Sig  . amLODipine (NORVASC) 10 MG tablet Take 1 tablet (10 mg total) by mouth daily.  .Marland Kitchenlosartan-hydrochlorothiazide (HYZAAR) 100-25 MG tablet Take 1 tablet by mouth daily.  . [DISCONTINUED] fluconazole (DIFLUCAN) 150 MG tablet Take 1 tablet (150 mg total) by mouth once a week.  . [DISCONTINUED] LISINOPRIL PO Take by mouth.  . [EXPIRED] cloNIDine (CATAPRES) tablet 0.2 mg    No facility-administered encounter medications on file as of 05/14/2020.    Follow-up: Return for schedule pap/reck Bp.   MKerin Perna NP

## 2020-05-15 LAB — CBC WITH DIFFERENTIAL/PLATELET
Eos: 2 %
Hemoglobin: 14.6 g/dL (ref 11.1–15.9)
Neutrophils Absolute: 1.7 10*3/uL (ref 1.4–7.0)
RBC: 4.95 x10E6/uL (ref 3.77–5.28)
WBC: 4.6 10*3/uL (ref 3.4–10.8)

## 2020-05-15 LAB — CMP14+EGFR
Chloride: 105 mmol/L (ref 96–106)
Creatinine, Ser: 1.08 mg/dL — ABNORMAL HIGH (ref 0.57–1.00)
Glucose: 105 mg/dL — ABNORMAL HIGH (ref 65–99)
Sodium: 145 mmol/L — ABNORMAL HIGH (ref 134–144)

## 2020-05-18 LAB — LIPID PANEL
Chol/HDL Ratio: 3.3 ratio (ref 0.0–4.4)
Cholesterol, Total: 157 mg/dL (ref 100–199)
HDL: 47 mg/dL (ref >39)
LDL Chol Calc (NIH): 92 mg/dL (ref 0–99)
Triglycerides: 98 mg/dL (ref 0–149)
VLDL Cholesterol Cal: 18 mg/dL (ref 5–40)

## 2020-05-18 LAB — CBC WITH DIFFERENTIAL/PLATELET
Basophils Absolute: 0 10*3/uL (ref 0.0–0.2)
Basos: 0 %
EOS (ABSOLUTE): 0.1 10*3/uL (ref 0.0–0.4)
Hematocrit: 44.8 % (ref 34.0–46.6)
Immature Grans (Abs): 0 10*3/uL (ref 0.0–0.1)
Immature Granulocytes: 0 %
Lymphocytes Absolute: 2.5 10*3/uL (ref 0.7–3.1)
Lymphs: 54 %
MCH: 29.5 pg (ref 26.6–33.0)
MCHC: 32.6 g/dL (ref 31.5–35.7)
MCV: 91 fL (ref 79–97)
Monocytes Absolute: 0.3 10*3/uL (ref 0.1–0.9)
Monocytes: 7 %
Neutrophils: 37 %
Platelets: 259 10*3/uL (ref 150–450)
RDW: 13.1 % (ref 11.7–15.4)

## 2020-05-18 LAB — CMP14+EGFR
ALT: 18 IU/L (ref 0–32)
AST: 23 IU/L (ref 0–40)
Albumin/Globulin Ratio: 1.3 (ref 1.2–2.2)
Albumin: 4.3 g/dL (ref 3.8–4.9)
Alkaline Phosphatase: 89 IU/L (ref 44–121)
BUN/Creatinine Ratio: 10 (ref 9–23)
BUN: 11 mg/dL (ref 6–24)
Bilirubin Total: 0.4 mg/dL (ref 0.0–1.2)
CO2: 22 mmol/L (ref 20–29)
Calcium: 9.3 mg/dL (ref 8.7–10.2)
Globulin, Total: 3.4 g/dL (ref 1.5–4.5)
Potassium: 3.8 mmol/L (ref 3.5–5.2)
Total Protein: 7.7 g/dL (ref 6.0–8.5)
eGFR: 60 mL/min/{1.73_m2} (ref >59)

## 2020-05-18 LAB — SEDIMENTATION RATE: Sed Rate: 24 mm/hr (ref 0–40)

## 2020-05-18 LAB — RHEUMATOID ARTHRITIS PROFILE
Cyclic Citrullin Peptide Ab: 5 units (ref 0–19)
Rhuematoid fact SerPl-aCnc: <10 IU/mL (ref <14.0)

## 2020-05-19 ENCOUNTER — Telehealth (INDEPENDENT_AMBULATORY_CARE_PROVIDER_SITE_OTHER): Payer: Self-pay

## 2020-05-19 NOTE — Telephone Encounter (Signed)
Per DPR left voicemail notifying patient that labs are normal. Return call to [facility] at [phone] with any questions or concerns. Nat Christen, CMA

## 2020-05-19 NOTE — Telephone Encounter (Signed)
-----   Message from Kerin Perna, NP sent at 05/17/2020 10:01 PM EDT ----- Labs are normal . Sed rate normal which can indicate inflammatory process if it was elevated ie arthritis

## 2020-06-02 ENCOUNTER — Other Ambulatory Visit (HOSPITAL_COMMUNITY)
Admission: RE | Admit: 2020-06-02 | Discharge: 2020-06-02 | Disposition: A | Discharge status: Home / Self Care | Payer: 59 | Source: Ambulatory Visit | Attending: Primary Care | Admitting: Primary Care

## 2020-06-02 ENCOUNTER — Other Ambulatory Visit: Payer: Self-pay

## 2020-06-02 ENCOUNTER — Ambulatory Visit (INDEPENDENT_AMBULATORY_CARE_PROVIDER_SITE_OTHER): Payer: 59 | Admitting: Primary Care

## 2020-06-02 ENCOUNTER — Encounter (INDEPENDENT_AMBULATORY_CARE_PROVIDER_SITE_OTHER): Payer: Self-pay | Admitting: Primary Care

## 2020-06-02 DIAGNOSIS — N912 Amenorrhea, unspecified: Secondary | ICD-10-CM

## 2020-06-02 DIAGNOSIS — Z1211 Encounter for screening for malignant neoplasm of colon: Secondary | ICD-10-CM | POA: Diagnosis not present

## 2020-06-02 DIAGNOSIS — Z01419 Encounter for gynecological examination (general) (routine) without abnormal findings: Secondary | ICD-10-CM

## 2020-06-02 DIAGNOSIS — Z124 Encounter for screening for malignant neoplasm of cervix: Secondary | ICD-10-CM | POA: Insufficient documentation

## 2020-06-02 DIAGNOSIS — Z1239 Encounter for other screening for malignant neoplasm of breast: Secondary | ICD-10-CM

## 2020-06-02 NOTE — Progress Notes (Signed)
Subjective:         Laurie Buckley is a 55 y.o. morbid female and is here for a comprehensive physical exam. The patient reports no problems.  Patient's blood pressure is unremarkable 124/84- Denies shortness of breath, headaches, chest pain or lower extremity edema, sudden onset, vision changes, unilateral weakness, dizziness, paresthesias  Social History   Socioeconomic History  . Marital status: Single    Spouse name: Not on file  . Number of children: Not on file  . Years of education: Not on file  . Highest education level: Not on file  Occupational History  . Not on file  Tobacco Use  . Smoking status: Never Smoker  . Smokeless tobacco: Never Used  Vaping Use  . Vaping Use: Never used  Substance and Sexual Activity  . Alcohol use: No  . Drug use: Not on file  . Sexual activity: Yes  Other Topics Concern  . Not on file  Social History Narrative  . Not on file   Social Determinants of Health   Financial Resource Strain: Not on file  Food Insecurity: Not on file  Transportation Needs: Not on file  Physical Activity: Not on file  Stress: Not on file  Social Connections: Not on file  Intimate Partner Violence: Not on file   Health Maintenance  Topic Date Due  . Hepatitis C Screening  Never done  . COVID-19 Vaccine (1) Never done  . HIV Screening  Never done  . PAP SMEAR-Modifier  Never done  . COLONOSCOPY (Pts 45-35yrs Insurance coverage will need to be confirmed)  Never done  . MAMMOGRAM  03/31/2016  . INFLUENZA VACCINE  09/06/2020  . TETANUS/TDAP  05/15/2030  . HPV VACCINES  Aged Out    The following portions of the patient's history were reviewed and updated as appropriate: allergies, current medications, past family history, past medical history, past social history, past surgical history and problem list.  Review of Systems A comprehensive review of systems was negative.   Objective:   BP 124/84 (BP Location: Right Arm, Patient Position: Sitting,  Cuff Size: Large)   Pulse 84   Temp (!) 97.5 F (36.4 C) (Temporal)   Ht 5\' 4"  (1.626 m)   Wt 264 lb 12.8 oz (120.1 kg)   SpO2 95%   BMI 45.45 kg/m   Assessment:    Healthy female exam.  CONSTITUTIONAL: Well-developed, well-nourished morbid obese female in no acute distress.  HENT:  Normocephalic, atraumatic, External right and left ear normal. Oropharynx is clear and moist EYES: Conjunctivae and EOM are normal. Pupils are equal, round, and reactive to light. No scleral icterus.  NECK: Normal range of motion, supple, no masses.  Normal thyroid.  SKIN: Skin is warm and dry. No rash noted. Not diaphoretic. No erythema. No pallor. Orange: Alert and oriented to person, place, and time. Normal reflexes, muscle tone coordination. No cranial nerve deficit noted. PSYCHIATRIC: Normal mood and affect. Normal behavior. Normal judgment and thought content. CARDIOVASCULAR: Normal heart rate noted, regular rhythm RESPIRATORY: Clear to auscultation bilaterally. Effort and breath sounds normal, no problems with respiration noted. BREASTS: Symmetric in size. No masses, skin changes, nipple drainage, or lymphadenopathy. ABDOMEN: Soft, normal bowel sounds, no distention noted.  No tenderness, rebound or guarding.  PELVIC: Normal appearing external genitalia; normal appearing vaginal mucosa and cervix.  No abnormal discharge noted.  Pap smear obtained.  Normal uterine size, no other palpable masses, no uterine or adnexal tenderness. MUSCULOSKELETAL: Normal range of motion. No  tenderness.  No cyanosis, clubbing, or edema.  2+ distal pulses.  Plan:  Laurie Buckley was seen today for gynecologic exam and blood pressure check.  Diagnoses and all orders for this visit:  Laurie Buckley was seen today for gynecologic exam and blood pressure check.  Diagnoses and all orders for this visit:  Screening for malignant neoplasm of cervix -     Cytology - PAP([place])  Breast screening -     [facility] DIGITAL SCREENING  BILATERAL; Future  Special screening for malignant neoplasms, colon. Ambulatory referral to gastrology for colon cancer screening.   Encounter for gynecological examination without abnormal finding Completed taught self breast examination and return demonstration  Absence of menstruation Postmenopausal    See After Visit Summary for Counseling Recommendations   Kerin Perna

## 2020-06-02 NOTE — Patient Instructions (Signed)

## 2020-06-04 LAB — CYTOLOGY - PAP: Diagnosis: NEGATIVE

## 2020-06-08 ENCOUNTER — Telehealth (INDEPENDENT_AMBULATORY_CARE_PROVIDER_SITE_OTHER): Payer: Self-pay

## 2020-06-08 NOTE — Telephone Encounter (Signed)
-----   Message from Kerin Perna, NP sent at 06/08/2020  1:19 PM EDT ----- Pap normal - due in 3 years

## 2020-06-08 NOTE — Telephone Encounter (Signed)
Spoke with patient. After verifying date of birth she was informed of normal pap. Repeat in 3 years. Nat Christen, CMA

## 2020-08-25 ENCOUNTER — Encounter: Payer: Self-pay | Admitting: Gastroenterology

## 2020-09-01 ENCOUNTER — Ambulatory Visit (INDEPENDENT_AMBULATORY_CARE_PROVIDER_SITE_OTHER): Payer: 59 | Admitting: Primary Care

## 2020-09-15 ENCOUNTER — Encounter (INDEPENDENT_AMBULATORY_CARE_PROVIDER_SITE_OTHER): Payer: Self-pay | Admitting: Nurse Practitioner

## 2020-09-15 ENCOUNTER — Ambulatory Visit (INDEPENDENT_AMBULATORY_CARE_PROVIDER_SITE_OTHER): Payer: 59 | Admitting: Nurse Practitioner

## 2020-09-15 ENCOUNTER — Other Ambulatory Visit: Payer: Self-pay

## 2020-09-15 VITALS — BP 139/90 | HR 90 | Temp 97.5°F | Ht 64.0 in | Wt 274.0 lb

## 2020-09-15 DIAGNOSIS — Z6841 Body Mass Index (BMI) 40.0 and over, adult: Secondary | ICD-10-CM

## 2020-09-15 DIAGNOSIS — E66813 Obesity, class 3: Secondary | ICD-10-CM

## 2020-09-15 NOTE — Progress Notes (Signed)
Subjective:    Patient here for follow-up of elevated blood pressure.  She is exercising and is adherent to a low-salt diet.  Blood pressure is well controlled at home. Cardiac symptoms: lower extremity edema. Patient denies: chest pain, chest pressure/discomfort, and syncope. Cardiovascular risk factors: hypertension and obesity (BMI >= 30 kg/m2). Use of agents associated with hypertension: none.     Review of Systems Review of Systems  Constitutional: Negative.   HENT: Negative.    Eyes: Negative.   Respiratory: Negative.    Cardiovascular: Negative.   Gastrointestinal: Negative.   Genitourinary: Negative.   Musculoskeletal: Negative.   Skin: Negative.   Neurological: Negative.   Endo/Heme/Allergies: Negative.   Psychiatric/Behavioral: Negative.        Objective:    Physical Exam Constitutional:      General: She is not in acute distress. Cardiovascular:     Rate and Rhythm: Normal rate and regular rhythm.  Pulmonary:     Effort: Pulmonary effort is normal.     Breath sounds: Normal breath sounds.  Skin:    General: Skin is warm and dry.  Neurological:     Mental Status: She is alert and oriented to person, place, and time.  Psychiatric:        Mood and Affect: Affect normal.      Assessment:    Hypertension, normal blood pressure today. Evidence of target organ damage: none.    Plan:    Dietary sodium restriction. Regular aerobic exercise. Check blood pressures weekly and record. Follow up: 3 months and as needed.   Patient Instructions  Hypertension:  Continue current medications  Continue to work on healthy diet  Continue to exercise  Will place referral for weight management  Follow up:  Follow up in 3 months with Juluis Mire or sooner if needed   Hypertension, Adult Hypertension is another name for high blood pressure. High blood pressure forces your heart to work harder to pump blood. This can cause problems overtime. There are two  numbers in a blood pressure reading. There is a top number (systolic) over a bottom number (diastolic). It is best to have a blood pressure that is below 120/80. Healthy choicescan help lower your blood pressure, or you may need medicine to help lower it. What are the causes? The cause of this condition is not known. Some conditions may be related tohigh blood pressure. What increases the risk? Smoking. Having type 2 diabetes mellitus, high cholesterol, or both. Not getting enough exercise or physical activity. Being overweight. Having too much fat, sugar, calories, or salt (sodium) in your diet. Drinking too much alcohol. Having long-term (chronic) kidney disease. Having a family history of high blood pressure. Age. Risk increases with age. Race. You may be at higher risk if you are African American. Gender. Men are at higher risk than women before age 32. After age 8, women are at higher risk than men. Having obstructive sleep apnea. Stress. What are the signs or symptoms? High blood pressure may not cause symptoms. Very high blood pressure (hypertensive crisis) may cause: Headache. Feelings of worry or nervousness (anxiety). Shortness of breath. Nosebleed. A feeling of being sick to your stomach (nausea). Throwing up (vomiting). Changes in how you see. Very bad chest pain. Seizures. How is this treated? This condition is treated by making healthy lifestyle changes, such as: Eating healthy foods. Exercising more. Drinking less alcohol. Your health care provider may prescribe medicine if lifestyle changes are not enough to get your blood pressure  under control, and if: Your top number is above 130. Your bottom number is above 80. Your personal target blood pressure may vary. Follow these instructions at home: Eating and drinking  If told, follow the DASH eating plan. To follow this plan: Fill one half of your plate at each meal with fruits and vegetables. Fill one fourth  of your plate at each meal with whole grains. Whole grains include whole-wheat pasta, brown rice, and whole-grain bread. Eat or drink low-fat dairy products, such as skim milk or low-fat yogurt. Fill one fourth of your plate at each meal with low-fat (lean) proteins. Low-fat proteins include fish, chicken without skin, eggs, beans, and tofu. Avoid fatty meat, cured and processed meat, or chicken with skin. Avoid pre-made or processed food. Eat less than 1,500 mg of salt each day. Do not drink alcohol if: Your doctor tells you not to drink. You are pregnant, may be pregnant, or are planning to become pregnant. If you drink alcohol: Limit how much you use to: 0-1 drink a day for women. 0-2 drinks a day for men. Be aware of how much alcohol is in your drink. In the [place] one drink equals one 12 oz bottle of beer (355 mL), one 5 oz glass of wine (148 mL), or one 1 oz glass of hard liquor (44 mL).  Lifestyle  Work with your doctor to stay at a healthy weight or to lose weight. Ask your doctor what the best weight is for you. Get at least 30 minutes of exercise most days of the week. This may include walking, swimming, or biking. Get at least 30 minutes of exercise that strengthens your muscles (resistance exercise) at least 3 days a week. This may include lifting weights or doing Pilates. Do not use any products that contain nicotine or tobacco, such as cigarettes, e-cigarettes, and chewing tobacco. If you need help quitting, ask your doctor. Check your blood pressure at home as told by your doctor. Keep all follow-up visits as told by your doctor. This is important.  Medicines Take over-the-counter and prescription medicines only as told by your doctor. Follow directions carefully. Do not skip doses of blood pressure medicine. The medicine does not work as well if you skip doses. Skipping doses also puts you at risk for problems. Ask your doctor about side effects or reactions to medicines  that you should watch for. Contact a doctor if you: Think you are having a reaction to the medicine you are taking. Have headaches that keep coming back (recurring). Feel dizzy. Have swelling in your ankles. Have trouble with your vision. Get help right away if you: Get a very bad headache. Start to feel mixed up (confused). Feel weak or numb. Feel faint. Have very bad pain in your: Chest. Belly (abdomen). Throw up more than once. Have trouble breathing. Summary Hypertension is another name for high blood pressure. High blood pressure forces your heart to work harder to pump blood. For most people, a normal blood pressure is less than 120/80. Making healthy choices can help lower blood pressure. If your blood pressure does not get lower with healthy choices, you may need to take medicine. This information is not intended to replace advice given to you by your health care provider. Make sure you discuss any questions you have with your healthcare provider. Document Revised: 10/03/2017 Document Reviewed: 10/03/2017 Elsevier Patient Education  2022 [place] Your Hypertension Hypertension, also called high blood pressure, is when the force of the  blood pressing against the walls of the arteries is too strong. Arteries are blood vessels that carry blood from your heart throughout your body. Hypertension forces the heart to work harder to pump blood and may cause the arteries tobecome narrow or stiff. Understanding blood pressure readings Your personal target blood pressure may vary depending on your medical conditions, your age, and other factors. A blood pressure reading includes a higher number over a lower number. Ideally, your blood pressure should be below 120/80. You should know that: The first, or top, number is called the systolic pressure. It is a measure of the pressure in your arteries as your heart beats. The second, or bottom number, is called the diastolic  pressure. It is a measure of the pressure in your arteries as the heart relaxes. Blood pressure is classified into four stages. Based on your blood pressure reading, your health care provider may use the following stages to determine what type of treatment you need, if any. Systolic pressure and diastolicpressure are measured in a unit called mmHg. Normal Systolic pressure: below 123456. Diastolic pressure: below 80. Elevated Systolic pressure: [id number]. Diastolic pressure: below 80. Hypertension stage 1 Systolic pressure: 0000000. Diastolic pressure: [id number]. Hypertension stage 2 Systolic pressure: [id number] or above. Diastolic pressure: 90 or above. How can this condition affect me? Managing your hypertension is an important responsibility. Over time, hypertension can damage the arteries and decrease blood flow to important parts of the body, including the brain, heart, and kidneys. Having untreated or uncontrolled hypertension can lead to: A heart attack. A stroke. A weakened blood vessel (aneurysm). Heart failure. Kidney damage. Eye damage. Metabolic syndrome. Memory and concentration problems. Vascular dementia. What actions can I take to manage this condition? Hypertension can be managed by making lifestyle changes and possibly by taking medicines. Your health care provider will help you make a plan to bring yourblood pressure within a normal range. Nutrition  Eat a diet that is high in fiber and potassium, and low in salt (sodium), added sugar, and fat. An example eating plan is called the Dietary Approaches to Stop Hypertension (DASH) diet. To eat this way: Eat plenty of fresh fruits and vegetables. Try to fill one-half of your plate at each meal with fruits and vegetables. Eat whole grains, such as whole-wheat pasta, brown rice, or whole-grain bread. Fill about one-fourth of your plate with whole grains. Eat low-fat dairy products. Avoid fatty cuts of meat, processed or cured meats, and  poultry with skin. Fill about one-fourth of your plate with lean proteins such as fish, chicken without skin, beans, eggs, and tofu. Avoid pre-made and processed foods. These tend to be higher in sodium, added sugar, and fat. Reduce your daily sodium intake. Most people with hypertension should eat less than 1,500 mg of sodium a day.  Lifestyle  Work with your health care provider to maintain a healthy body weight or to lose weight. Ask what an ideal weight is for you. Get at least 30 minutes of exercise that causes your heart to beat faster (aerobic exercise) most days of the week. Activities may include walking, swimming, or biking. Include exercise to strengthen your muscles (resistance exercise), such as weight lifting, as part of your weekly exercise routine. Try to do these types of exercises for 30 minutes at least 3 days a week. Do not use any products that contain nicotine or tobacco, such as cigarettes, e-cigarettes, and chewing tobacco. If you need help quitting, ask your health care provider. Control any long-term (  chronic) conditions you have, such as high cholesterol or diabetes. Identify your sources of stress and find ways to manage stress. This may include meditation, deep breathing, or making time for fun activities.  Alcohol use Do not drink alcohol if: Your health care provider tells you not to drink. You are pregnant, may be pregnant, or are planning to become pregnant. If you drink alcohol: Limit how much you use to: 0-1 drink a day for women. 0-2 drinks a day for men. Be aware of how much alcohol is in your drink. In the [place] one drink equals one 12 oz bottle of beer (355 mL), one 5 oz glass of wine (148 mL), or one 1 oz glass of hard liquor (44 mL). Medicines Your health care provider may prescribe medicine if lifestyle changes are not enough to get your blood pressure under control and if: Your systolic blood pressure is 130 or higher. Your diastolic blood pressure  is 80 or higher. Take medicines only as told by your health care provider. Follow the directions carefully. Blood pressure medicines must be taken as told by your health care provider. The medicine does not work as well when you skip doses. Skippingdoses also puts you at risk for problems. Monitoring Before you monitor your blood pressure: Do not smoke, drink caffeinated beverages, or exercise within 30 minutes before taking a measurement. Use the bathroom and empty your bladder (urinate). Sit quietly for at least 5 minutes before taking measurements. Monitor your blood pressure at home as told by your health care provider. To do this: Sit with your back straight and supported. Place your feet flat on the floor. Do not cross your legs. Support your arm on a flat surface, such as a table. Make sure your upper arm is at heart level. Each time you measure, take two or three readings one minute apart and record the results. You may also need to have your blood pressure checked regularly by your healthcare provider. General information Talk with your health care provider about your diet, exercise habits, and other lifestyle factors that may be contributing to hypertension. Review all the medicines you take with your health care provider because there may be side effects or interactions. Keep all visits as told by your health care provider. Your health care provider can help you create and adjust your plan for managing your high blood pressure. Where to find more information National Heart, Lung, and Blood Institute: https://wilson-eaton.com/ American Heart Association: www.heart.org Contact a health care provider if: You think you are having a reaction to medicines you have taken. You have repeated (recurrent) headaches. You feel dizzy. You have swelling in your ankles. You have trouble with your vision. Get help right away if: You develop a severe headache or confusion. You have unusual weakness or  numbness, or you feel faint. You have severe pain in your chest or abdomen. You vomit repeatedly. You have trouble breathing. These symptoms may represent a serious problem that is an emergency. Do not wait to see if the symptoms will go away. Get medical help right away. Call your local emergency services (911 in the [place] Do not drive yourself to the hospital. Summary Hypertension is when the force of blood pumping through your arteries is too strong. If this condition is not controlled, it may put you at risk for serious complications. Your personal target blood pressure may vary depending on your medical conditions, your age, and other factors. For most people, a normal blood pressure is less  than 120/80. Hypertension is managed by lifestyle changes, medicines, or both. Lifestyle changes to help manage hypertension include losing weight, eating a healthy, low-sodium diet, exercising more, stopping smoking, and limiting alcohol. This information is not intended to replace advice given to you by your health care provider. Make sure you discuss any questions you have with your healthcare provider. Document Revised: 02/28/2019 Document Reviewed: 12/24/2018 Elsevier Patient Education  [email or web address].  [email or web address]">  DASH Eating Plan DASH stands for Dietary Approaches to Stop Hypertension. The DASH eating plan is a healthy eating plan that has been shown to: Reduce high blood pressure (hypertension). Reduce your risk for type 2 diabetes, heart disease, and stroke. Help with weight loss. What are tips for following this plan? Reading food labels Check food labels for the amount of salt (sodium) per serving. Choose foods with less than 5 percent of the Daily Value of sodium. Generally, foods with less than 300 milligrams (mg) of sodium per serving fit into this eating plan. To find whole grains, look for the word "whole" as the first word  in the ingredient list. Shopping Buy products labeled as "low-sodium" or "no salt added." Buy fresh foods. Avoid canned foods and pre-made or frozen meals. Cooking Avoid adding salt when cooking. Use salt-free seasonings or herbs instead of table salt or sea salt. Check with your health care provider or pharmacist before using salt substitutes. Do not fry foods. Cook foods using healthy methods such as baking, boiling, grilling, roasting, and broiling instead. Cook with heart-healthy oils, such as olive, canola, avocado, soybean, or sunflower oil. Meal planning  Eat a balanced diet that includes: 4 or more servings of fruits and 4 or more servings of vegetables each day. Try to fill one-half of your plate with fruits and vegetables. 6-8 servings of whole grains each day. Less than 6 oz (170 g) of lean meat, poultry, or fish each day. A 3-oz (85-g) serving of meat is about the same size as a deck of cards. One egg equals 1 oz (28 g). 2-3 servings of low-fat dairy each day. One serving is 1 cup (237 mL). 1 serving of nuts, seeds, or beans 5 times each week. 2-3 servings of heart-healthy fats. Healthy fats called omega-3 fatty acids are found in foods such as walnuts, flaxseeds, fortified milks, and eggs. These fats are also found in cold-water fish, such as sardines, salmon, and mackerel. Limit how much you eat of: Canned or prepackaged foods. Food that is high in trans fat, such as some fried foods. Food that is high in saturated fat, such as fatty meat. Desserts and other sweets, sugary drinks, and other foods with added sugar. Full-fat dairy products. Do not salt foods before eating. Do not eat more than 4 egg yolks a week. Try to eat at least 2 vegetarian meals a week. Eat more home-cooked food and less restaurant, buffet, and fast food.  Lifestyle When eating at a restaurant, ask that your food be prepared with less salt or no salt, if possible. If you drink alcohol: Limit how much  you use to: 0-1 drink a day for women who are not pregnant. 0-2 drinks a day for men. Be aware of how much alcohol is in your drink. In the [place] one drink equals one 12 oz bottle of beer (355 mL), one 5 oz glass of wine (148 mL), or one 1 oz glass of hard liquor (44 mL). General information Avoid eating more than 2,300 mg of salt a day.  If you have hypertension, you may need to reduce your sodium intake to 1,500 mg a day. Work with your health care provider to maintain a healthy body weight or to lose weight. Ask what an ideal weight is for you. Get at least 30 minutes of exercise that causes your heart to beat faster (aerobic exercise) most days of the week. Activities may include walking, swimming, or biking. Work with your health care provider or dietitian to adjust your eating plan to your individual calorie needs. What foods should I eat? Fruits All fresh, dried, or frozen fruit. Canned fruit in natural juice (without addedsugar). Vegetables Fresh or frozen vegetables (raw, steamed, roasted, or grilled). Low-sodium or reduced-sodium tomato and vegetable juice. Low-sodium or reduced-sodium tomatosauce and tomato paste. Low-sodium or reduced-sodium canned vegetables. Grains Whole-grain or whole-wheat bread. Whole-grain or whole-wheat pasta. Brown rice. [place]. Bulgur. Whole-grain and low-sodium cereals. Pita bread.Low-fat, low-sodium crackers. Whole-wheat flour tortillas. Meats and other proteins Skinless chicken or [place]. Ground chicken or [place]. Pork with fat trimmed off. Fish and seafood. Egg whites. Dried beans, peas, or lentils. Unsalted nuts, nut butters, and seeds. Unsalted canned beans. Lean cuts of beef with fat trimmed off. Low-sodium, lean precooked or cured meat, such as sausages or meatloaves. Dairy Low-fat (1%) or fat-free (skim) milk. Reduced-fat, low-fat, or fat-free cheeses. Nonfat, low-sodium ricotta or cottage cheese. Low-fat or nonfatyogurt. Low-fat, low-sodium  cheese. Fats and oils Soft margarine without trans fats. Vegetable oil. Reduced-fat, low-fat, or light mayonnaise and salad dressings (reduced-sodium). Canola, safflower, olive, avocado, soybean, andsunflower oils. Avocado. Seasonings and condiments Herbs. Spices. Seasoning mixes without salt. Other foods Unsalted popcorn and pretzels. Fat-free sweets. The items listed above may not be a complete list of foods and beverages you can eat. Contact a dietitian for more information. What foods should I avoid? Fruits Canned fruit in a light or heavy syrup. Fried fruit. Fruit in cream or buttersauce. Vegetables Creamed or fried vegetables. Vegetables in a cheese sauce. Regular canned vegetables (not low-sodium or reduced-sodium). Regular canned tomato sauce and paste (not low-sodium or reduced-sodium). Regular tomato and vegetable juice(not low-sodium or reduced-sodium). Angie Fava. Olives. Grains Baked goods made with fat, such as croissants, muffins, or some breads. Drypasta or rice meal packs. Meats and other proteins Fatty cuts of meat. Ribs. Fried meat. Berniece Salines. Bologna, salami, and other precooked or cured meats, such as sausages or meat loaves. Fat from the back of a pig (fatback). Bratwurst. Salted nuts and seeds. Canned beans with added salt. Canned orsmoked fish. Whole eggs or egg yolks. Chicken or [place] with skin. Dairy Whole or 2% milk, cream, and half-and-half. Whole or full-fat cream cheese. Whole-fat or sweetened yogurt. Full-fat cheese. Nondairy creamers. Whippedtoppings. Processed cheese and cheese spreads. Fats and oils Butter. Stick margarine. Lard. Shortening. Ghee. Bacon fat. Tropical oils, suchas coconut, palm kernel, or palm oil. Seasonings and condiments Onion salt, garlic salt, seasoned salt, table salt, and sea salt. Worcestershire sauce. Tartar sauce. Barbecue sauce. Teriyaki sauce. Soy sauce, including reduced-sodium. Steak sauce. Canned and packaged gravies. Fish sauce. Oyster  sauce. Cocktail sauce. Store-bought horseradish. Ketchup. Mustard. Meat flavorings and tenderizers. Bouillon cubes. Hot sauces. Pre-made or packaged marinades. Pre-made or packaged taco seasonings. Relishes. Regular saladdressings. Other foods Salted popcorn and pretzels. The items listed above may not be a complete list of foods and beverages you should avoid. Contact a dietitian for more information. Where to find more information National Heart, Lung, and Blood Institute: https://wilson-eaton.com/ American Heart Association: www.heart.org Academy of Nutrition and Dietetics:  www.eatright.[place]: www.kidney.org Summary The DASH eating plan is a healthy eating plan that has been shown to reduce high blood pressure (hypertension). It may also reduce your risk for type 2 diabetes, heart disease, and stroke. When on the DASH eating plan, aim to eat more fresh fruits and vegetables, whole grains, lean proteins, low-fat dairy, and heart-healthy fats. With the DASH eating plan, you should limit salt (sodium) intake to 2,300 mg a day. If you have hypertension, you may need to reduce your sodium intake to 1,500 mg a day. Work with your health care provider or dietitian to adjust your eating plan to your individual calorie needs. This information is not intended to replace advice given to you by your health care provider. Make sure you discuss any questions you have with your healthcare provider. Document Revised: 12/27/2018 Document Reviewed: 12/27/2018 Elsevier Patient Education  2022 [employer].

## 2020-09-15 NOTE — Patient Instructions (Addendum)
Hypertension:  Continue current medications  Continue to work on healthy diet  Continue to exercise  Will place referral for weight management  Follow up:  Follow up in 3 months with Laurie Buckley or sooner if needed   Hypertension, Adult Hypertension is another name for high blood pressure. High blood pressure forces your heart to work harder to pump blood. This can cause problems overtime. There are two numbers in a blood pressure reading. There is a top number (systolic) over a bottom number (diastolic). It is best to have a blood pressure that is below 120/80. Healthy choicescan help lower your blood pressure, or you may need medicine to help lower it. What are the causes? The cause of this condition is not known. Some conditions may be related tohigh blood pressure. What increases the risk? Smoking. Having type 2 diabetes mellitus, high cholesterol, or both. Not getting enough exercise or physical activity. Being overweight. Having too much fat, sugar, calories, or salt (sodium) in your diet. Drinking too much alcohol. Having long-term (chronic) kidney disease. Having a family history of high blood pressure. Age. Risk increases with age. Race. You may be at higher risk if you are African American. Gender. Men are at higher risk than women before age 3. After age 2, women are at higher risk than men. Having obstructive sleep apnea. Stress. What are the signs or symptoms? High blood pressure may not cause symptoms. Very high blood pressure (hypertensive crisis) may cause: Headache. Feelings of worry or nervousness (anxiety). Shortness of breath. Nosebleed. A feeling of being sick to your stomach (nausea). Throwing up (vomiting). Changes in how you see. Very bad chest pain. Seizures. How is this treated? This condition is treated by making healthy lifestyle changes, such as: Eating healthy foods. Exercising more. Drinking less alcohol. Your health care  provider may prescribe medicine if lifestyle changes are not enough to get your blood pressure under control, and if: Your top number is above 130. Your bottom number is above 80. Your personal target blood pressure may vary. Follow these instructions at home: Eating and drinking  If told, follow the DASH eating plan. To follow this plan: Fill one half of your plate at each meal with fruits and vegetables. Fill one fourth of your plate at each meal with whole grains. Whole grains include whole-wheat pasta, brown rice, and whole-grain bread. Eat or drink low-fat dairy products, such as skim milk or low-fat yogurt. Fill one fourth of your plate at each meal with low-fat (lean) proteins. Low-fat proteins include fish, chicken without skin, eggs, beans, and tofu. Avoid fatty meat, cured and processed meat, or chicken with skin. Avoid pre-made or processed food. Eat less than 1,500 mg of salt each day. Do not drink alcohol if: Your doctor tells you not to drink. You are pregnant, may be pregnant, or are planning to become pregnant. If you drink alcohol: Limit how much you use to: 0-1 drink a day for women. 0-2 drinks a day for men. Be aware of how much alcohol is in your drink. In the [place] one drink equals one 12 oz bottle of beer (355 mL), one 5 oz glass of wine (148 mL), or one 1 oz glass of hard liquor (44 mL).  Lifestyle  Work with your doctor to stay at a healthy weight or to lose weight. Ask your doctor what the best weight is for you. Get at least 30 minutes of exercise most days of the week. This may include walking, swimming, or  biking. Get at least 30 minutes of exercise that strengthens your muscles (resistance exercise) at least 3 days a week. This may include lifting weights or doing Pilates. Do not use any products that contain nicotine or tobacco, such as cigarettes, e-cigarettes, and chewing tobacco. If you need help quitting, ask your doctor. Check your blood pressure at  home as told by your doctor. Keep all follow-up visits as told by your doctor. This is important.  Medicines Take over-the-counter and prescription medicines only as told by your doctor. Follow directions carefully. Do not skip doses of blood pressure medicine. The medicine does not work as well if you skip doses. Skipping doses also puts you at risk for problems. Ask your doctor about side effects or reactions to medicines that you should watch for. Contact a doctor if you: Think you are having a reaction to the medicine you are taking. Have headaches that keep coming back (recurring). Feel dizzy. Have swelling in your ankles. Have trouble with your vision. Get help right away if you: Get a very bad headache. Start to feel mixed up (confused). Feel weak or numb. Feel faint. Have very bad pain in your: Chest. Belly (abdomen). Throw up more than once. Have trouble breathing. Summary Hypertension is another name for high blood pressure. High blood pressure forces your heart to work harder to pump blood. For most people, a normal blood pressure is less than 120/80. Making healthy choices can help lower blood pressure. If your blood pressure does not get lower with healthy choices, you may need to take medicine. This information is not intended to replace advice given to you by your health care provider. Make sure you discuss any questions you have with your healthcare provider. Document Revised: 10/03/2017 Document Reviewed: 10/03/2017 Elsevier Patient Education  2022 [place] Your Hypertension Hypertension, also called high blood pressure, is when the force of the blood pressing against the walls of the arteries is too strong. Arteries are blood vessels that carry blood from your heart throughout your body. Hypertension forces the heart to work harder to pump blood and may cause the arteries tobecome narrow or stiff. Understanding blood pressure readings Your personal  target blood pressure may vary depending on your medical conditions, your age, and other factors. A blood pressure reading includes a higher number over a lower number. Ideally, your blood pressure should be below 120/80. You should know that: The first, or top, number is called the systolic pressure. It is a measure of the pressure in your arteries as your heart beats. The second, or bottom number, is called the diastolic pressure. It is a measure of the pressure in your arteries as the heart relaxes. Blood pressure is classified into four stages. Based on your blood pressure reading, your health care provider may use the following stages to determine what type of treatment you need, if any. Systolic pressure and diastolicpressure are measured in a unit called mmHg. Normal Systolic pressure: below 123456. Diastolic pressure: below 80. Elevated Systolic pressure: [id number]. Diastolic pressure: below 80. Hypertension stage 1 Systolic pressure: 0000000. Diastolic pressure: [id number]. Hypertension stage 2 Systolic pressure: [id number] or above. Diastolic pressure: 90 or above. How can this condition affect me? Managing your hypertension is an important responsibility. Over time, hypertension can damage the arteries and decrease blood flow to important parts of the body, including the brain, heart, and kidneys. Having untreated or uncontrolled hypertension can lead to: A heart attack. A stroke. A weakened blood vessel (aneurysm). Heart  failure. Kidney damage. Eye damage. Metabolic syndrome. Memory and concentration problems. Vascular dementia. What actions can I take to manage this condition? Hypertension can be managed by making lifestyle changes and possibly by taking medicines. Your health care provider will help you make a plan to bring yourblood pressure within a normal range. Nutrition  Eat a diet that is high in fiber and potassium, and low in salt (sodium), added sugar, and fat. An example eating  plan is called the Dietary Approaches to Stop Hypertension (DASH) diet. To eat this way: Eat plenty of fresh fruits and vegetables. Try to fill one-half of your plate at each meal with fruits and vegetables. Eat whole grains, such as whole-wheat pasta, brown rice, or whole-grain bread. Fill about one-fourth of your plate with whole grains. Eat low-fat dairy products. Avoid fatty cuts of meat, processed or cured meats, and poultry with skin. Fill about one-fourth of your plate with lean proteins such as fish, chicken without skin, beans, eggs, and tofu. Avoid pre-made and processed foods. These tend to be higher in sodium, added sugar, and fat. Reduce your daily sodium intake. Most people with hypertension should eat less than 1,500 mg of sodium a day.  Lifestyle  Work with your health care provider to maintain a healthy body weight or to lose weight. Ask what an ideal weight is for you. Get at least 30 minutes of exercise that causes your heart to beat faster (aerobic exercise) most days of the week. Activities may include walking, swimming, or biking. Include exercise to strengthen your muscles (resistance exercise), such as weight lifting, as part of your weekly exercise routine. Try to do these types of exercises for 30 minutes at least 3 days a week. Do not use any products that contain nicotine or tobacco, such as cigarettes, e-cigarettes, and chewing tobacco. If you need help quitting, ask your health care provider. Control any long-term (chronic) conditions you have, such as high cholesterol or diabetes. Identify your sources of stress and find ways to manage stress. This may include meditation, deep breathing, or making time for fun activities.  Alcohol use Do not drink alcohol if: Your health care provider tells you not to drink. You are pregnant, may be pregnant, or are planning to become pregnant. If you drink alcohol: Limit how much you use to: 0-1 drink a day for women. 0-2  drinks a day for men. Be aware of how much alcohol is in your drink. In the [place] one drink equals one 12 oz bottle of beer (355 mL), one 5 oz glass of wine (148 mL), or one 1 oz glass of hard liquor (44 mL). Medicines Your health care provider may prescribe medicine if lifestyle changes are not enough to get your blood pressure under control and if: Your systolic blood pressure is 130 or higher. Your diastolic blood pressure is 80 or higher. Take medicines only as told by your health care provider. Follow the directions carefully. Blood pressure medicines must be taken as told by your health care provider. The medicine does not work as well when you skip doses. Skippingdoses also puts you at risk for problems. Monitoring Before you monitor your blood pressure: Do not smoke, drink caffeinated beverages, or exercise within 30 minutes before taking a measurement. Use the bathroom and empty your bladder (urinate). Sit quietly for at least 5 minutes before taking measurements. Monitor your blood pressure at home as told by your health care provider. To do this: Sit with your back straight and  supported. Place your feet flat on the floor. Do not cross your legs. Support your arm on a flat surface, such as a table. Make sure your upper arm is at heart level. Each time you measure, take two or three readings one minute apart and record the results. You may also need to have your blood pressure checked regularly by your healthcare provider. General information Talk with your health care provider about your diet, exercise habits, and other lifestyle factors that may be contributing to hypertension. Review all the medicines you take with your health care provider because there may be side effects or interactions. Keep all visits as told by your health care provider. Your health care provider can help you create and adjust your plan for managing your high blood pressure. Where to find more  information National Heart, Lung, and Blood Institute: https://wilson-eaton.com/ American Heart Association: www.heart.org Contact a health care provider if: You think you are having a reaction to medicines you have taken. You have repeated (recurrent) headaches. You feel dizzy. You have swelling in your ankles. You have trouble with your vision. Get help right away if: You develop a severe headache or confusion. You have unusual weakness or numbness, or you feel faint. You have severe pain in your chest or abdomen. You vomit repeatedly. You have trouble breathing. These symptoms may represent a serious problem that is an emergency. Do not wait to see if the symptoms will go away. Get medical help right away. Call your local emergency services (911 in the [place] Do not drive yourself to the hospital. Summary Hypertension is when the force of blood pumping through your arteries is too strong. If this condition is not controlled, it may put you at risk for serious complications. Your personal target blood pressure may vary depending on your medical conditions, your age, and other factors. For most people, a normal blood pressure is less than 120/80. Hypertension is managed by lifestyle changes, medicines, or both. Lifestyle changes to help manage hypertension include losing weight, eating a healthy, low-sodium diet, exercising more, stopping smoking, and limiting alcohol. This information is not intended to replace advice given to you by your health care provider. Make sure you discuss any questions you have with your healthcare provider. Document Revised: 02/28/2019 Document Reviewed: 12/24/2018 Elsevier Patient Education  [place].  [email or web address]">  DASH Eating Plan DASH stands for Dietary Approaches to Stop Hypertension. The DASH eating plan is a healthy eating plan that has been shown to: Reduce high blood pressure  (hypertension). Reduce your risk for type 2 diabetes, heart disease, and stroke. Help with weight loss. What are tips for following this plan? Reading food labels Check food labels for the amount of salt (sodium) per serving. Choose foods with less than 5 percent of the Daily Value of sodium. Generally, foods with less than 300 milligrams (mg) of sodium per serving fit into this eating plan. To find whole grains, look for the word "whole" as the first word in the ingredient list. Shopping Buy products labeled as "low-sodium" or "no salt added." Buy fresh foods. Avoid canned foods and pre-made or frozen meals. Cooking Avoid adding salt when cooking. Use salt-free seasonings or herbs instead of table salt or sea salt. Check with your health care provider or pharmacist before using salt substitutes. Do not fry foods. Cook foods using healthy methods such as baking, boiling, grilling, roasting, and broiling instead. Cook with heart-healthy oils, such as olive, canola, avocado, soybean, or sunflower oil. Meal planning  Eat a balanced diet that includes: 4 or more servings of fruits and 4 or more servings of vegetables each day. Try to fill one-half of your plate with fruits and vegetables. 6-8 servings of whole grains each day. Less than 6 oz (170 g) of lean meat, poultry, or fish each day. A 3-oz (85-g) serving of meat is about the same size as a deck of cards. One egg equals 1 oz (28 g). 2-3 servings of low-fat dairy each day. One serving is 1 cup (237 mL). 1 serving of nuts, seeds, or beans 5 times each week. 2-3 servings of heart-healthy fats. Healthy fats called omega-3 fatty acids are found in foods such as walnuts, flaxseeds, fortified milks, and eggs. These fats are also found in cold-water fish, such as sardines, salmon, and mackerel. Limit how much you eat of: Canned or prepackaged foods. Food that is high in trans fat, such as some fried foods. Food that is high in saturated fat, such  as fatty meat. Desserts and other sweets, sugary drinks, and other foods with added sugar. Full-fat dairy products. Do not salt foods before eating. Do not eat more than 4 egg yolks a week. Try to eat at least 2 vegetarian meals a week. Eat more home-cooked food and less restaurant, buffet, and fast food.  Lifestyle When eating at a restaurant, ask that your food be prepared with less salt or no salt, if possible. If you drink alcohol: Limit how much you use to: 0-1 drink a day for women who are not pregnant. 0-2 drinks a day for men. Be aware of how much alcohol is in your drink. In the [place] one drink equals one 12 oz bottle of beer (355 mL), one 5 oz glass of wine (148 mL), or one 1 oz glass of hard liquor (44 mL). General information Avoid eating more than 2,300 mg of salt a day. If you have hypertension, you may need to reduce your sodium intake to 1,500 mg a day. Work with your health care provider to maintain a healthy body weight or to lose weight. Ask what an ideal weight is for you. Get at least 30 minutes of exercise that causes your heart to beat faster (aerobic exercise) most days of the week. Activities may include walking, swimming, or biking. Work with your health care provider or dietitian to adjust your eating plan to your individual calorie needs. What foods should I eat? Fruits All fresh, dried, or frozen fruit. Canned fruit in natural juice (without addedsugar). Vegetables Fresh or frozen vegetables (raw, steamed, roasted, or grilled). Low-sodium or reduced-sodium tomato and vegetable juice. Low-sodium or reduced-sodium tomatosauce and tomato paste. Low-sodium or reduced-sodium canned vegetables. Grains Whole-grain or whole-wheat bread. Whole-grain or whole-wheat pasta. Brown rice. [place]. Bulgur. Whole-grain and low-sodium cereals. Pita bread.Low-fat, low-sodium crackers. Whole-wheat flour tortillas. Meats and other proteins Skinless chicken or [place].  Ground chicken or [place]. Pork with fat trimmed off. Fish and seafood. Egg whites. Dried beans, peas, or lentils. Unsalted nuts, nut butters, and seeds. Unsalted canned beans. Lean cuts of beef with fat trimmed off. Low-sodium, lean precooked or cured meat, such as sausages or meatloaves. Dairy Low-fat (1%) or fat-free (skim) milk. Reduced-fat, low-fat, or fat-free cheeses. Nonfat, low-sodium ricotta or cottage cheese. Low-fat or nonfatyogurt. Low-fat, low-sodium cheese. Fats and oils Soft margarine without trans fats. Vegetable oil. Reduced-fat, low-fat, or light mayonnaise and salad dressings (reduced-sodium). Canola, safflower, olive, avocado, soybean, andsunflower oils. Avocado. Seasonings and condiments Herbs. Spices. Seasoning mixes without  salt. Other foods Unsalted popcorn and pretzels. Fat-free sweets. The items listed above may not be a complete list of foods and beverages you can eat. Contact a dietitian for more information. What foods should I avoid? Fruits Canned fruit in a light or heavy syrup. Fried fruit. Fruit in cream or buttersauce. Vegetables Creamed or fried vegetables. Vegetables in a cheese sauce. Regular canned vegetables (not low-sodium or reduced-sodium). Regular canned tomato sauce and paste (not low-sodium or reduced-sodium). Regular tomato and vegetable juice(not low-sodium or reduced-sodium). Angie Fava. Olives. Grains Baked goods made with fat, such as croissants, muffins, or some breads. Drypasta or rice meal packs. Meats and other proteins Fatty cuts of meat. Ribs. Fried meat. Berniece Salines. Bologna, salami, and other precooked or cured meats, such as sausages or meat loaves. Fat from the back of a pig (fatback). Bratwurst. Salted nuts and seeds. Canned beans with added salt. Canned orsmoked fish. Whole eggs or egg yolks. Chicken or [place] with skin. Dairy Whole or 2% milk, cream, and half-and-half. Whole or full-fat cream cheese. Whole-fat or sweetened yogurt. Full-fat  cheese. Nondairy creamers. Whippedtoppings. Processed cheese and cheese spreads. Fats and oils Butter. Stick margarine. Lard. Shortening. Ghee. Bacon fat. Tropical oils, suchas coconut, palm kernel, or palm oil. Seasonings and condiments Onion salt, garlic salt, seasoned salt, table salt, and sea salt. Worcestershire sauce. Tartar sauce. Barbecue sauce. Teriyaki sauce. Soy sauce, including reduced-sodium. Steak sauce. Canned and packaged gravies. Fish sauce. Oyster sauce. Cocktail sauce. Store-bought horseradish. Ketchup. Mustard. Meat flavorings and tenderizers. Bouillon cubes. Hot sauces. Pre-made or packaged marinades. Pre-made or packaged taco seasonings. Relishes. Regular saladdressings. Other foods Salted popcorn and pretzels. The items listed above may not be a complete list of foods and beverages you should avoid. Contact a dietitian for more information. Where to find more information National Heart, Lung, and Blood Institute: https://wilson-eaton.com/ American Heart Association: www.heart.org Academy of Nutrition and Dietetics: www.eatright.[place]: www.kidney.org Summary The DASH eating plan is a healthy eating plan that has been shown to reduce high blood pressure (hypertension). It may also reduce your risk for type 2 diabetes, heart disease, and stroke. When on the DASH eating plan, aim to eat more fresh fruits and vegetables, whole grains, lean proteins, low-fat dairy, and heart-healthy fats. With the DASH eating plan, you should limit salt (sodium) intake to 2,300 mg a day. If you have hypertension, you may need to reduce your sodium intake to 1,500 mg a day. Work with your health care provider or dietitian to adjust your eating plan to your individual calorie needs. This information is not intended to replace advice given to you by your health care provider. Make sure you discuss any questions you have with your healthcare provider. Document Revised: 12/27/2018  Document Reviewed: 12/27/2018 Elsevier Patient Education  2022 [employer].

## 2020-09-24 ENCOUNTER — Other Ambulatory Visit: Payer: Self-pay

## 2020-09-24 ENCOUNTER — Ambulatory Visit (AMBULATORY_SURGERY_CENTER): Payer: Self-pay

## 2020-09-24 VITALS — Ht 64.0 in | Wt 274.0 lb

## 2020-09-24 DIAGNOSIS — Z1211 Encounter for screening for malignant neoplasm of colon: Secondary | ICD-10-CM

## 2020-09-24 MED ORDER — NA SULFATE-K SULFATE-MG SULF 17.5-3.13-1.6 GM/177ML PO SOLN: 1.0000 | Freq: Once | ORAL | 0 refills | Status: AC

## 2020-09-24 NOTE — Progress Notes (Signed)
No egg or soy allergy known to patient  No issues with past sedation with any surgeries or procedures Patient denies ever being told they had issues or difficulty with intubation  No FH of Malignant Hyperthermia No diet pills per patient No home 02 use per patient  No blood thinners per patient  Pt denies issues with constipation  No A fib or A flutter   COVID 19 guidelines implemented in [facility] today with Pt and RN   NO PA's for preps discussed with pt in [facility] today  Discussed with pt there will be an out-of-pocket cost for prep and that varies from $0 to 70 dollars   Due to the COVID-19 pandemic we are asking patients to follow certain guidelines.  Pt aware of COVID protocols and [place] guidelines   Patient reports she has been exposed to her daughter that tested COVID + (with home kit) on 09/09/2020, however, patient is not currently experiencing any symptoms and has tested negative with a home kit on 09/16/2020.  Patient retested her daughter with a home kit on 09/15/2020 and her daughter tested negative on that date. Patient advised if she starts experiencing any COVID related symptoms she needs to call and inform the office;

## 2020-10-01 ENCOUNTER — Telehealth (INDEPENDENT_AMBULATORY_CARE_PROVIDER_SITE_OTHER): Payer: Self-pay

## 2020-10-01 NOTE — Telephone Encounter (Signed)
Copied from [place] [phone]. Topic: Appointment Scheduling - Scheduling Inquiry for Clinic >> Oct 01, 2020 10:07 AM Scherrie Gerlach wrote: Reason for CRM: pt is covid positive and has a severe cough, runny nose. Pt is requesting virtual visit for cough med and anti-viral. Please call pt either way.   Opened in error.

## 2020-10-01 NOTE — Telephone Encounter (Signed)
Does patient need virtual appt or can something be sent to pharmacy without appt. Nat Christen, CMA

## 2020-10-03 ENCOUNTER — Other Ambulatory Visit (INDEPENDENT_AMBULATORY_CARE_PROVIDER_SITE_OTHER): Payer: Self-pay | Admitting: Primary Care

## 2020-10-03 MED ORDER — BENZONATATE 100 MG PO CAPS: 100.0000 mg | ORAL_CAPSULE | Freq: Two times a day (BID) | ORAL | 0 refills | Status: DC | PRN

## 2020-10-04 NOTE — Telephone Encounter (Signed)
Patient also requested anti-viral. Will you send that as well.

## 2020-10-05 NOTE — Telephone Encounter (Signed)
Called patient did a tele visit with [facility] gave her Molnupiravir. Previously sent in tessalon pearls for cough. She was not aware although a text message from pharmacy said medication ready for pick up. She will pick up cough medication today

## 2020-10-08 ENCOUNTER — Encounter: Payer: 59 | Admitting: Gastroenterology

## 2020-10-10 ENCOUNTER — Encounter: Payer: Self-pay | Admitting: Emergency Medicine

## 2020-10-10 ENCOUNTER — Ambulatory Visit
Admission: EM | Admit: 2020-10-10 | Discharge: 2020-10-10 | Disposition: A | Discharge status: Home / Self Care | Payer: 59 | Attending: Urgent Care | Admitting: Urgent Care

## 2020-10-10 ENCOUNTER — Other Ambulatory Visit: Payer: Self-pay

## 2020-10-10 DIAGNOSIS — H1032 Unspecified acute conjunctivitis, left eye: Secondary | ICD-10-CM | POA: Diagnosis not present

## 2020-10-10 MED ORDER — AZELASTINE HCL 0.05 % OP SOLN: 1.0000 [drp] | Freq: Two times a day (BID) | OPHTHALMIC | 12 refills | Status: DC

## 2020-10-10 MED ORDER — POLYMYXIN B-TRIMETHOPRIM 10000-0.1 UNIT/ML-% OP SOLN: 1.0000 [drp] | OPHTHALMIC | 0 refills | Status: DC

## 2020-10-10 NOTE — ED Provider Notes (Signed)
[place]   MRN: [id number] DOB: Jun 15, 1965  Subjective:   Laurie Buckley is a 55 y.o. female presenting for 2-day history of acute onset persistent left eye irritation, redness, slight drainage this morning.  Denies fever, vision change, blurred vision, photophobia, eye trauma, contact lens use, eyelid pain or swelling.  She did use some clear ice yesterday with some relief.  Denies history of glaucoma.  No current facility-administered medications for this encounter.  Current Outpatient Medications:    amLODipine (NORVASC) 10 MG tablet, Take 1 tablet (10 mg total) by mouth daily., Disp: 90 tablet, Rfl: 3   benzonatate (TESSALON) 100 MG capsule, Take 1 capsule (100 mg total) by mouth 2 (two) times daily as needed for cough., Disp: 20 capsule, Rfl: 0   BLACK CURRANT SEED OIL PO, Take 1 Dose by mouth daily at 6 (six) AM., Disp: , Rfl:    Cholecalciferol (VITAMIN D3 PO), Take by mouth., Disp: , Rfl:    Cholecalciferol (VITAMIN D3) 25 MCG/SPRAY LIQD, Take 4 sprays by mouth daily at 6 (six) AM., Disp: , Rfl:    Flax OIL, Take 1,000 mg by mouth daily at 6 (six) AM., Disp: , Rfl:    losartan-hydrochlorothiazide (HYZAAR) 100-25 MG tablet, Take 1 tablet by mouth daily., Disp: 90 tablet, Rfl: 3   UBIQUINOL PO, Take 100 mg by mouth daily at 6 (six) AM., Disp: , Rfl:    No Known Allergies  Past Medical History:  Diagnosis Date   GERD (gastroesophageal reflux disease)    with certain foods   Hypertension    on meds   Sleep apnea    uses CPAP     Past Surgical History:  Procedure Laterality Date   TUBAL LIGATION  1996   WISDOM TOOTH EXTRACTION      Family History  Problem Relation Age of Onset   Colon polyps Brother 6   Colon cancer Neg Hx    Esophageal cancer Neg Hx    Rectal cancer Neg Hx    Stomach cancer Neg Hx     Social History   Tobacco Use   Smoking status: Never   Smokeless tobacco: Never  Vaping Use   Vaping Use: Never used  Substance Use Topics    Alcohol use: No   Drug use: Never    ROS   Objective:   Vitals: BP 123/77 (BP Location: Left Arm)   Pulse 84   Temp 98.6 F (37 C) (Oral)   Resp 18   SpO2 98%   Physical Exam Constitutional:      General: She is not in acute distress.    Appearance: Normal appearance. She is well-developed. She is not ill-appearing, toxic-appearing or diaphoretic.  HENT:     Head: Normocephalic and atraumatic.     Nose: Nose normal.     Mouth/Throat:     Mouth: Mucous membranes are moist.     Pharynx: Oropharynx is clear.  Eyes:     General: Lids are everted, no foreign bodies appreciated. No scleral icterus.       Right eye: No foreign body, discharge or hordeolum.        Left eye: Discharge (slight, watery) present.No foreign body or hordeolum.     Extraocular Movements: Extraocular movements intact.     Conjunctiva/sclera:     Left eye: Left conjunctiva is injected. No chemosis, exudate or hemorrhage.    Pupils: Pupils are equal, round, and reactive to light.  Cardiovascular:     Rate and  Rhythm: Normal rate.  Pulmonary:     Effort: Pulmonary effort is normal.  Skin:    General: Skin is warm and dry.  Neurological:     General: No focal deficit present.     Mental Status: She is alert and oriented to person, place, and time.  Psychiatric:        Mood and Affect: Mood normal.        Behavior: Behavior normal.    Assessment and Plan :   PDMP not reviewed this encounter.  1. Acute conjunctivitis of left eye, unspecified acute conjunctivitis type    Start Polytrim to address suspected mild case of acute bacterial conjunctivitis.  Recommended azelastine eyedrops as well for supportive care.  Low suspicion for an acute abdominal emergency, glaucoma and therefore will defer urgent visit with an ophthalmologist. Counseled patient on potential for adverse effects with medications prescribed/recommended today, ER and return-to-clinic precautions discussed, patient verbalized  understanding.    Jaynee Eagles, PA-C 10/10/20 1153

## 2020-10-10 NOTE — ED Triage Notes (Signed)
Pt here for left eye irritation with watering and redness starting yesterday

## 2020-10-22 ENCOUNTER — Other Ambulatory Visit: Payer: Self-pay

## 2020-10-22 ENCOUNTER — Ambulatory Visit (AMBULATORY_SURGERY_CENTER): Payer: 59 | Admitting: Gastroenterology

## 2020-10-22 ENCOUNTER — Encounter: Payer: Self-pay | Admitting: Gastroenterology

## 2020-10-22 VITALS — BP 112/78 | HR 81 | Temp 98.4°F | Resp 14 | Ht 64.0 in | Wt 274.0 lb

## 2020-10-22 DIAGNOSIS — K635 Polyp of colon: Secondary | ICD-10-CM | POA: Diagnosis not present

## 2020-10-22 DIAGNOSIS — K621 Rectal polyp: Secondary | ICD-10-CM | POA: Diagnosis not present

## 2020-10-22 DIAGNOSIS — Z1211 Encounter for screening for malignant neoplasm of colon: Secondary | ICD-10-CM | POA: Diagnosis not present

## 2020-10-22 DIAGNOSIS — D125 Benign neoplasm of sigmoid colon: Secondary | ICD-10-CM

## 2020-10-22 DIAGNOSIS — D123 Benign neoplasm of transverse colon: Secondary | ICD-10-CM

## 2020-10-22 DIAGNOSIS — D128 Benign neoplasm of rectum: Secondary | ICD-10-CM

## 2020-10-22 DIAGNOSIS — D129 Benign neoplasm of anus and anal canal: Secondary | ICD-10-CM

## 2020-10-22 DIAGNOSIS — D127 Benign neoplasm of rectosigmoid junction: Secondary | ICD-10-CM | POA: Diagnosis not present

## 2020-10-22 MED ORDER — SODIUM CHLORIDE 0.9 % IV SOLN
500.0000 mL | Freq: Once | INTRAVENOUS | Status: DC
Start: 2020-10-22 — End: 2020-10-22

## 2020-10-22 NOTE — Progress Notes (Signed)
PT taken to PACU. Monitors in place. VSS. Report given to RN. 

## 2020-10-22 NOTE — Progress Notes (Signed)
[facility] History and Physical   Primary Care Physician:  Kerin Perna, NP   Reason for Procedure:   Colon cancer screening  Plan:    Screening colonoscopy     HPI: Laurie Buckley is a 55 y.o. female undergoing initial average risk screening colonoscopy.  She has no chronic GI symptoms.  Her uncle had colon cancer in his 53s, otherwise no family history of colon cancer.   Past Medical History:  Diagnosis Date   GERD (gastroesophageal reflux disease)    with certain foods   Hypertension    on meds   Sleep apnea    uses CPAP    Past Surgical History:  Procedure Laterality Date   TUBAL LIGATION  1996   WISDOM TOOTH EXTRACTION      Prior to Admission medications   Medication Sig Start Date End Date Taking? Authorizing Provider  amLODipine (NORVASC) 10 MG tablet Take 1 tablet (10 mg total) by mouth daily. 05/14/20  Yes Kerin Perna, NP  losartan-hydrochlorothiazide (HYZAAR) 100-25 MG tablet Take 1 tablet by mouth daily. 05/14/20  Yes Kerin Perna, NP  azelastine (OPTIVAR) 0.05 % ophthalmic solution Place 1 drop into the left eye 2 (two) times daily. 10/10/20   Jaynee Eagles, PA-C  benzonatate (TESSALON) 100 MG capsule Take 1 capsule (100 mg total) by mouth 2 (two) times daily as needed for cough. 10/03/20   Kerin Perna, NP  BLACK CURRANT SEED OIL PO Take 1 Dose by mouth daily at 6 (six) AM.    [provider]  Cholecalciferol (VITAMIN D3 PO) Take by mouth.    [provider]  Cholecalciferol (VITAMIN D3) 25 MCG/SPRAY LIQD Take 4 sprays by mouth daily at 6 (six) AM.    [provider]  Flax OIL Take 1,000 mg by mouth daily at 6 (six) AM.    [provider]  trimethoprim-polymyxin b (POLYTRIM) ophthalmic solution Place 1 drop into the left eye every 4 (four) hours. 10/10/20   Jaynee Eagles, PA-C  UBIQUINOL PO Take 100 mg by mouth daily at 6 (six) AM.    [provider]  LISINOPRIL PO Take by mouth.   03/10/20  [provider]    Current Outpatient Medications  Medication Sig Dispense Refill   amLODipine (NORVASC) 10 MG tablet Take 1 tablet (10 mg total) by mouth daily. 90 tablet 3   losartan-hydrochlorothiazide (HYZAAR) 100-25 MG tablet Take 1 tablet by mouth daily. 90 tablet 3   azelastine (OPTIVAR) 0.05 % ophthalmic solution Place 1 drop into the left eye 2 (two) times daily. 6 mL 12   benzonatate (TESSALON) 100 MG capsule Take 1 capsule (100 mg total) by mouth 2 (two) times daily as needed for cough. 20 capsule 0   BLACK CURRANT SEED OIL PO Take 1 Dose by mouth daily at 6 (six) AM.     Cholecalciferol (VITAMIN D3 PO) Take by mouth.     Cholecalciferol (VITAMIN D3) 25 MCG/SPRAY LIQD Take 4 sprays by mouth daily at 6 (six) AM.     Flax OIL Take 1,000 mg by mouth daily at 6 (six) AM.     trimethoprim-polymyxin b (POLYTRIM) ophthalmic solution Place 1 drop into the left eye every 4 (four) hours. 10 mL 0   UBIQUINOL PO Take 100 mg by mouth daily at 6 (six) AM.     Current Facility-Administered Medications  Medication Dose Route Frequency Provider Last Rate Last Admin   0.9 %  sodium chloride infusion  500 mL Intravenous Once Daryel November, MD        Allergies as of 10/22/2020   (No Known Allergies)    Family History  Problem Relation Age of Onset   Colon polyps Brother 73   Colon cancer Maternal Uncle    Esophageal cancer Neg Hx    Rectal cancer Neg Hx    Stomach cancer Neg Hx     Social History   Socioeconomic History   Marital status: Single    Spouse name: Not on file   Number of children: Not on file   Years of education: Not on file   Highest education level: Not on file  Occupational History   Not on file  Tobacco Use   Smoking status: Never   Smokeless tobacco: Never  Vaping Use   Vaping Use: Never used  Substance and Sexual Activity   Alcohol use: No   Drug use: Never   Sexual activity: Yes  Other Topics Concern   Not on file  Social  History Narrative   Not on file   Social Determinants of Health   Financial Resource Strain: Not on file  Food Insecurity: Not on file  Transportation Needs: Not on file  Physical Activity: Not on file  Stress: Not on file  Social Connections: Not on file  Intimate Partner Violence: Not on file    Review of Systems: All other review of systems negative except as mentioned in the HPI.  Physical Exam: Vital signs BP 136/73   Pulse 86   Temp 98.4 F (36.9 C)   Ht '5\' 4"'$  (1.626 m)   Wt 274 lb (124.3 kg)   SpO2 97%   BMI 47.03 kg/m   General:   Alert,  Well-developed, well-nourished, pleasant and cooperative in NAD MP2 Lungs:  Clear throughout to auscultation.   Heart:  Regular rate and rhythm; no murmurs, clicks, rubs,  or gallops. Abdomen:  Soft, nontender and nondistended. Normal bowel sounds.   Neuro/Psych:  Normal mood and affect. A and O x 3   Samai Corea E. Candis Schatz, MD [facility]

## 2020-10-22 NOTE — Progress Notes (Signed)
Vital signs checked by:CW  The patient states no changes in medical or surgical history since pre-visit screening on 09/24/2020.

## 2020-10-22 NOTE — Op Note (Signed)
[place] Patient Name: Laurie Buckley Procedure Date: 10/22/2020 8:25 AM MRN: [id number] Endoscopist: Nicki Reaper E. Candis Schatz , MD Age: 55 Referring MD:  Date of Birth: Mar 06, 1965 Gender: Female Account #: [id number] Procedure:                Colonoscopy Indications:              Screening for colorectal malignant neoplasm, This                            is the patient's first colonoscopy Medicines:                Monitored Anesthesia Care Procedure:                Pre-Anesthesia Assessment:                           - Prior to the procedure, a History and Physical                            was performed, and patient medications and                            allergies were reviewed. The patient's tolerance of                            previous anesthesia was also reviewed. The risks                            and benefits of the procedure and the sedation                            options and risks were discussed with the patient.                            All questions were answered, and informed consent                            was obtained. Prior Anticoagulants: The patient has                            taken no previous anticoagulant or antiplatelet                            agents. ASA Grade Assessment: III - A patient with                            severe systemic disease. After reviewing the risks                            and benefits, the patient was deemed in                            satisfactory condition to undergo the procedure.  After obtaining informed consent, the colonoscope                            was passed under direct vision. Throughout the                            procedure, the patient's blood pressure, pulse, and                            oxygen saturations were monitored continuously. The                            Olympus CF-HQ190L [id number]) Colonoscope was                            introduced through the  anus and advanced to the the                            cecum, identified by appendiceal orifice and                            ileocecal valve. The colonoscopy was performed                            without difficulty. The patient tolerated the                            procedure well. The quality of the bowel                            preparation was good. The ileocecal valve,                            appendiceal orifice, and rectum were photographed.                            The bowel preparation used was SUPREP via split                            dose instruction. Scope In: 8:44:28 AM Scope Out: 9:04:16 AM Scope Withdrawal Time: 0 hours 15 minutes 20 seconds  Total Procedure Duration: 0 hours 19 minutes 48 seconds  Findings:                 The perianal and digital rectal examinations were                            normal. Pertinent negatives include normal                            sphincter tone and no palpable rectal lesions.                           A 4 mm polyp was found in the splenic flexure. The  polyp was sessile. The polyp was removed with a                            cold snare. Resection and retrieval were complete.                            Estimated blood loss was minimal.                           A 3 mm polyp was found in the sigmoid colon. The                            polyp was sessile. The polyp was removed with a                            cold snare. Resection and retrieval were complete.                            Estimated blood loss was minimal.                           A 3 mm polyp was found in the rectum. The polyp was                            sessile. The polyp was removed with a cold snare.                            Resection and retrieval were complete. Estimated                            blood loss was minimal.                           A few small-mouthed diverticula were found in the                             descending colon.                           The exam was otherwise normal throughout the                            examined colon.                           The retroflexed view of the distal rectum and anal                            verge was normal and showed no anal or rectal                            abnormalities. Complications:            No immediate complications. Estimated Blood Loss:     Estimated  blood loss was minimal. Impression:               - One 4 mm polyp at the splenic flexure, removed                            with a cold snare. Resected and retrieved.                           - One 3 mm polyp in the sigmoid colon, removed with                            a cold snare. Resected and retrieved.                           - One 3 mm polyp in the rectum, removed with a cold                            snare. Resected and retrieved.                           - Diverticulosis in the descending colon.                           - The distal rectum and anal verge are normal on                            retroflexion view. Recommendation:           - Patient has a contact number available for                            emergencies. The signs and symptoms of potential                            delayed complications were discussed with the                            patient. Return to normal activities tomorrow.                            Written discharge instructions were provided to the                            patient.                           - Resume previous diet.                           - Continue present medications.                           - Await pathology results.                           - Repeat colonoscopy (date  not yet determined) for                            surveillance based on pathology results. Kinsley Nicklaus E. Candis Schatz, MD 10/22/2020 9:09:52 AM This report has been signed electronically.

## 2020-10-22 NOTE — Progress Notes (Signed)
Called to room to assist during endoscopic procedure.  Patient ID and intended procedure confirmed with present staff. Received instructions for my participation in the procedure from the performing physician.  

## 2020-10-22 NOTE — Progress Notes (Signed)
Data not automatically transferring due to a system malfunction.

## 2020-10-22 NOTE — Patient Instructions (Signed)
Read all of the handouts given to you by your recovery room nurse. ? ?YOU HAD AN ENDOSCOPIC PROCEDURE TODAY AT THE [facility]:   Refer to the procedure report that was given to you for any specific questions about what was found during the examination.  If the procedure report does not answer your questions, please call your gastroenterologist to clarify.  If you requested that your care partner not be given the details of your procedure findings, then the procedure report has been included in a sealed envelope for you to review at your convenience later. ? ?YOU SHOULD EXPECT: Some feelings of bloating in the abdomen. Passage of more gas than usual.  Walking can help get rid of the air that was put into your GI tract during the procedure and reduce the bloating. If you had a lower endoscopy (such as a colonoscopy or flexible sigmoidoscopy) you may notice spotting of blood in your stool or on the toilet paper. If you underwent a bowel prep for your procedure, you may not have a normal bowel movement for a few days. ? ?Please Note:  You might notice some irritation and congestion in your nose or some drainage.  This is from the oxygen used during your procedure.  There is no need for concern and it should clear up in a day or so. ? ?SYMPTOMS TO REPORT IMMEDIATELY: ? ?Following lower endoscopy (colonoscopy or flexible sigmoidoscopy): ? Excessive amounts of blood in the stool ? Significant tenderness or worsening of abdominal pains ? Swelling of the abdomen that is new, acute ? Fever of 100?F or higher ? ?  ?For urgent or emergent issues, a gastroenterologist can be reached at any hour by calling [phone]. ?Do not use MyChart messaging for urgent concerns.  ? ? ?DIET:  We do recommend a small meal at first, but then you may proceed to your regular diet.  Drink plenty of fluids but you should avoid alcoholic beverages for 24 hours. ? ?ACTIVITY:  You should plan to take it easy for the rest of today  and you should NOT DRIVE or use heavy machinery until tomorrow (because of the sedation medicines used during the test).   ? ?FOLLOW UP: ?Our staff will call the number listed on your records 48-72 hours following your procedure to check on you and address any questions or concerns that you may have regarding the information given to you following your procedure. If we do not reach you, we will leave a message.  We will attempt to reach you two times.  During this call, we will ask if you have developed any symptoms of COVID 19. If you develop any symptoms (ie: fever, flu-like symptoms, shortness of breath, cough etc.) before then, please call [phone].  If you test positive for Covid 19 in the 2 weeks post procedure, please call and report this information to us.   ? ?If any biopsies were taken you will be contacted by phone or by letter within the next 1-3 weeks.  Please call us at [phone] if you have not heard about the biopsies in 3 weeks.  ? ? ?SIGNATURES/CONFIDENTIALITY: ?You and/or your care partner have signed paperwork which will be entered into your electronic medical record.  These signatures attest to the fact that that the information above on your After Visit Summary has been reviewed and is understood.  Full responsibility of the confidentiality of this discharge information lies with you and/or your care-partner.  ?

## 2020-10-26 ENCOUNTER — Telehealth: Payer: Self-pay

## 2020-10-26 NOTE — Telephone Encounter (Signed)
  Follow up Call-  Call back number 10/22/2020  Post procedure Call Back phone  # [phone]  Permission to leave phone message Yes  Some recent data might be hidden     Patient questions:  Do you have a fever, pain , or abdominal swelling? No. Pain Score  0 *  Have you tolerated food without any problems? Yes.    Have you been able to return to your normal activities? Yes.    Do you have any questions about your discharge instructions: Diet   No. Medications  No. Follow up visit  No.  Do you have questions or concerns about your Care? No.  Actions: * If pain score is 4 or above: No action needed, pain <4.

## 2020-10-27 ENCOUNTER — Encounter: Payer: Self-pay | Admitting: Gastroenterology

## 2020-12-16 ENCOUNTER — Other Ambulatory Visit: Payer: Self-pay

## 2020-12-16 ENCOUNTER — Encounter (INDEPENDENT_AMBULATORY_CARE_PROVIDER_SITE_OTHER): Payer: Self-pay | Admitting: Primary Care

## 2020-12-16 ENCOUNTER — Ambulatory Visit (INDEPENDENT_AMBULATORY_CARE_PROVIDER_SITE_OTHER): Payer: 59 | Admitting: Primary Care

## 2020-12-16 DIAGNOSIS — Z2821 Immunization not carried out because of patient refusal: Secondary | ICD-10-CM

## 2020-12-16 DIAGNOSIS — I10 Essential (primary) hypertension: Secondary | ICD-10-CM | POA: Diagnosis not present

## 2020-12-16 NOTE — Patient Instructions (Addendum)

## 2020-12-22 NOTE — Progress Notes (Signed)
Subjective:     [place]  Ms. Laurie Buckley is here for follow-up of elevated blood pressure.  She is exercising and is not adherent to a low-salt diet.  Blood pressure is not well controlled at home. Cardiac symptoms: none. Patient denies: chest pain, dyspnea, exertional chest pressure/discomfort, fatigue, near-syncope, and palpitations. Cardiovascular risk factors: advanced age (older than 55 for men, 2 for women) and obesity (BMI >= 30 kg/m2). Use of agents associated with hypertension: none. History of target organ damage: none.  Patient is noncompliant not taking medications.  Explain the purpose of the medication and appointment was to control blood pressure to decrease comorbidities.  The following portions of the patient's history were reviewed and updated as appropriate:  allergies, current medications, past family history, past medical history, past social history, and past surgical history.  Review of Systems Pertinent items noted in HPI and remainder of comprehensive ROS otherwise negative.     Objective:    BP (!) 148/90 (BP Location: Right Arm, Patient Position: Sitting, Cuff Size: Large)   Pulse 88   Temp (!) 97.5 F (36.4 C) (Temporal)   Ht 5\' 4"  (1.626 m)   Wt 273 lb 12.8 oz (124.2 kg)   SpO2 93%   BMI 47.00 kg/m   Physical exam: General: Vital signs reviewed.  Patient is well-developed and well-nourished, morbid obese female in no acute distress and cooperative with exam. Head: Normocephalic and atraumatic. Eyes: EOMI, conjunctivae normal, no scleral icterus. Neck: Supple, trachea midline, normal ROM, no JVD, masses, thyromegaly, or carotid bruit present. Cardiovascular: RRR, S1 normal, S2 normal, no murmurs, gallops, or rubs. Pulmonary/Chest: Clear to auscultation bilaterally, no wheezes, rales, or rhonchi. Abdominal: Soft, non-tender, non-distended, BS +, no masses, organomegaly, or guarding present. Musculoskeletal: No joint deformities,  erythema, or stiffness, ROM full and nontender. Extremities: No lower extremity edema bilaterally,  pulses symmetric and intact bilaterally. No cyanosis or clubbing. Neurological: A&O x3, Strength is normal Skin: Warm, dry and intact. No rashes or erythema. Psychiatric: Normal mood and affect. speech and behavior is normal. Cognition and memory are normal.     Assessment:  Laurie Buckley was seen today for hypertension.  Diagnoses and all orders for this visit:  Essential hypertension  Hypertension, uncontrolled.  Counseled on blood pressure goal of less than 130/80, low-sodium, DASH diet, medication compliance, 150 minutes of moderate intensity exercise per week. Discussed medication compliance, adverse effects.  Discussed risk factors African-American morbid obese female with uncontrolled hypertension increased risk for stroke heart attack.  Morbid obesity (HCC) BMI greater than 40 morbid obesity indicating an excess in caloric intake or underlining conditions. This may lead to other co-morbidities. Lifestyle modifications of diet and exercise may reduce obesity.     Influenza vaccination declined  Juluis Mire, NP

## 2020-12-29 ENCOUNTER — Ambulatory Visit (INDEPENDENT_AMBULATORY_CARE_PROVIDER_SITE_OTHER): Payer: 59 | Admitting: Primary Care

## 2020-12-29 ENCOUNTER — Other Ambulatory Visit: Payer: Self-pay

## 2020-12-29 ENCOUNTER — Other Ambulatory Visit: Payer: Self-pay | Admitting: Nurse Practitioner

## 2020-12-29 ENCOUNTER — Telehealth (INDEPENDENT_AMBULATORY_CARE_PROVIDER_SITE_OTHER): Payer: Self-pay | Admitting: *Deleted

## 2020-12-29 DIAGNOSIS — R059 Cough, unspecified: Secondary | ICD-10-CM

## 2020-12-29 MED ORDER — PROMETHAZINE-CODEINE 6.25-10 MG/5ML PO SOLN: 5.0000 mL | Freq: Two times a day (BID) | ORAL | 0 refills | Status: DC

## 2020-12-29 MED ORDER — PROMETHAZINE-DM 6.25-15 MG/5ML PO SYRP: 5.0000 mL | ORAL_SOLUTION | Freq: Four times a day (QID) | ORAL | 0 refills | Status: DC | PRN

## 2020-12-29 NOTE — Progress Notes (Signed)
I connected with  Laurie Buckley on 12/29/20 by a video enabled telemedicine application and verified that I am speaking with the correct person using two identifiers.   I discussed the limitations of evaluation and management by telemedicine. The patient expressed understanding and agreed to proceed.    Taking Tussim DM and tessalon pearls not helping  Cough began yesterday no other symptoms.  Cough is making her expel a little urine on herself

## 2020-12-29 NOTE — Telephone Encounter (Signed)
Pt seen today for cough. Prescription for Promethazine-Codeine called in to [facility]. Pt states not available. NT called [facility], tech states "No [facility] will carry that med any longer." Pt request send to [facility] on [address]. On call provider called, information provided.

## 2021-01-02 NOTE — Progress Notes (Signed)
  [place]  Virtual Visit via Telephone Note  I connected with Laurie Buckley, on 01/02/2021 at 10:24 PM  by telephone and verified that I am speaking with the correct person using two identifiers.   Consent: I discussed the limitations, risks, security and privacy concerns of performing an evaluation and management service by telephone and the availability of in person appointments. I also discussed with the patient that there may be a patient responsible charge related to this service. The patient expressed understanding and agreed to proceed.   Location of Patient: Home  Location of Provider: [facility]   Persons participating in Telemedicine visit: Laurie Buckley Juluis Mire,  NP [place] , CMA  History of Present Illness: Laurie Buckley a 55 year old obese female previous visit received disturbing news regarding her daughter and visit was cut short.  Today she is calling with concerns of chronic productive cough not responsive to [facility] and requesting something stronger especially to help sleep at night.   Past Medical History:  Diagnosis Date   GERD (gastroesophageal reflux disease)    with certain foods   Hypertension    on meds   Sleep apnea    uses CPAP   No Known Allergies  Current Outpatient Medications on File Prior to Visit  Medication Sig Dispense Refill   amLODipine (NORVASC) 10 MG tablet Take 1 tablet (10 mg total) by mouth daily. 90 tablet 3   BLACK CURRANT SEED OIL PO Take 1 Dose by mouth daily at 6 (six) AM.     Cholecalciferol (VITAMIN D3) 25 MCG/SPRAY LIQD Take 4 sprays by mouth daily at 6 (six) AM.     Flax OIL Take 1,000 mg by mouth daily at 6 (six) AM.     losartan-hydrochlorothiazide (HYZAAR) 100-25 MG tablet Take 1 tablet by mouth daily. 90 tablet 3   UBIQUINOL PO Take 100 mg by mouth daily at 6 (six) AM.     [DISCONTINUED] LISINOPRIL PO Take by mouth.      No current facility-administered medications on file prior to visit.    Observations/Objective: Productive cough.  No vital signs taken  Assessment and Plan: Laurie Buckley was seen today for cough.  Diagnoses and all orders for this visit:  Cough, unspecified type -     Promethazine-Codeine 6.25-10 MG/5ML SOLN; Take 5 mLs by mouth 2 (two) times daily. Every 12 hours as needed    Follow Up Instructions: 2 months   I discussed the assessment and treatment plan with the patient. The patient was provided an opportunity to ask questions and all were answered. The patient agreed with the plan and demonstrated an understanding of the instructions.   The patient was advised to call back or seek an in-person evaluation if the symptoms worsen or if the condition fails to improve as anticipated.     I provided 10 minutes total of non-face-to-face time during this encounter including median intraservice time, reviewing previous notes, investigations, ordering medications, medical decision making, coordinating care and patient verbalized understanding at the end of the visit.    This note has been created with Surveyor, quantity. Any transcriptional errors are unintentional.   Kerin Perna, NP 01/02/2021, 10:24 PM

## 2021-01-03 NOTE — Telephone Encounter (Signed)
Sent to PCP ?

## 2021-01-17 ENCOUNTER — Ambulatory Visit (INDEPENDENT_AMBULATORY_CARE_PROVIDER_SITE_OTHER): Payer: 59 | Admitting: Primary Care

## 2021-02-21 ENCOUNTER — Ambulatory Visit: Payer: 59

## 2021-02-21 ENCOUNTER — Ambulatory Visit (INDEPENDENT_AMBULATORY_CARE_PROVIDER_SITE_OTHER): Payer: 59

## 2021-02-21 ENCOUNTER — Ambulatory Visit
Admission: EM | Admit: 2021-02-21 | Discharge: 2021-02-21 | Disposition: A | Discharge status: Home / Self Care | Payer: 59 | Attending: Physician Assistant | Admitting: Physician Assistant

## 2021-02-21 ENCOUNTER — Other Ambulatory Visit: Payer: Self-pay

## 2021-02-21 DIAGNOSIS — J209 Acute bronchitis, unspecified: Secondary | ICD-10-CM

## 2021-02-21 DIAGNOSIS — R062 Wheezing: Secondary | ICD-10-CM

## 2021-02-21 DIAGNOSIS — R0602 Shortness of breath: Secondary | ICD-10-CM | POA: Diagnosis not present

## 2021-02-21 MED ORDER — PREDNISONE 20 MG PO TABS: 40.0000 mg | ORAL_TABLET | Freq: Every day | ORAL | 0 refills | Status: AC

## 2021-02-21 NOTE — ED Provider Notes (Signed)
[facility]    CSN: [id number] Arrival date & time: 02/21/21  1243      History   Chief Complaint Chief Complaint  Patient presents with   Cough    HPI Laurie Buckley is a 56 y.o. female.   Patient here today for evaluation of cough she has had for the last 2 weeks.  She states that cough is productive at times that she has also noticed some wheezing.  She has tried Mining engineer and Promethazine DM as well as Alka-Seltzer without resolution but some temporary relief.  She has not had any shortness of breath.  She denies fever.  The history is provided by the patient.  Cough Associated symptoms: wheezing   Associated symptoms: no chills, no ear pain, no eye discharge, no fever, no shortness of breath and no sore throat    Past Medical History:  Diagnosis Date   GERD (gastroesophageal reflux disease)    with certain foods   Hypertension    on meds   Sleep apnea    uses CPAP    There are no problems to display for this patient.   Past Surgical History:  Procedure Laterality Date   TUBAL LIGATION  1996   WISDOM TOOTH EXTRACTION      OB History   No obstetric history on file.      Home Medications    Prior to Admission medications   Medication Sig Start Date End Date Taking? Authorizing Provider  predniSONE (DELTASONE) 20 MG tablet Take 2 tablets (40 mg total) by mouth daily with breakfast for 5 days. 02/21/21 02/26/21 Yes Francene Finders, PA-C  amLODipine (NORVASC) 10 MG tablet Take 1 tablet (10 mg total) by mouth daily. 05/14/20   Kerin Perna, NP  BLACK CURRANT SEED OIL PO Take 1 Dose by mouth daily at 6 (six) AM.    [provider]  Cholecalciferol (VITAMIN D3) 25 MCG/SPRAY LIQD Take 4 sprays by mouth daily at 6 (six) AM.    [provider]  Flax OIL Take 1,000 mg by mouth daily at 6 (six) AM.    [provider]  losartan-hydrochlorothiazide (HYZAAR) 100-25 MG tablet Take 1 tablet by mouth daily. 05/14/20   Kerin Perna, NP  Promethazine-Codeine 6.25-10 MG/5ML SOLN Take 5 mLs by mouth 2 (two) times daily. Every 12 hours as needed 12/29/20   Kerin Perna, NP  promethazine-dextromethorphan (PROMETHAZINE-DM) 6.25-15 MG/5ML syrup Take 5 mLs by mouth 4 (four) times daily as needed for cough. 12/29/20   Gildardo Pounds, NP  UBIQUINOL PO Take 100 mg by mouth daily at 6 (six) AM.    [provider]  LISINOPRIL PO Take by mouth.  03/10/20  [provider]    Family History Family History  Problem Relation Age of Onset   Colon polyps Brother 70   Colon cancer Maternal Uncle    Esophageal cancer Neg Hx    Rectal cancer Neg Hx    Stomach cancer Neg Hx     Social History Social History   Tobacco Use   Smoking status: Never   Smokeless tobacco: Never  Vaping Use   Vaping Use: Never used  Substance Use Topics   Alcohol use: No   Drug use: Never     Allergies   Patient has no known allergies.   Review of Systems Review of Systems  Constitutional:  Negative for chills and fever.  HENT:  Positive for congestion. Negative for ear pain and  sore throat.   Eyes:  Negative for discharge and redness.  Respiratory:  Positive for cough and wheezing. Negative for shortness of breath.   Gastrointestinal:  Negative for abdominal pain, diarrhea, nausea and vomiting.    Physical Exam Triage Vital Signs ED Triage Vitals  Enc Vitals Group     BP 02/21/21 1353 128/87     Pulse Rate 02/21/21 1353 94     Resp 02/21/21 1353 18     Temp 02/21/21 1353 98.6 F (37 C)     Temp Source 02/21/21 1353 Oral     SpO2 02/21/21 1353 95 %     Weight --      Height --      Head Circumference --      Peak Flow --      Pain Score 02/21/21 1354 0     Pain Loc --      Pain Edu? --      Excl. in [place]? --    No data found.  Updated Vital Signs BP 128/87 (BP Location: Left Arm)    Pulse 94    Temp 98.6 F (37 C) (Oral)    Resp 18    SpO2 95%       Physical Exam Vitals and nursing note  reviewed.  Constitutional:      General: She is not in acute distress.    Appearance: Normal appearance. She is not ill-appearing.  HENT:     Head: Normocephalic and atraumatic.     Nose: Congestion present.     Mouth/Throat:     Mouth: Mucous membranes are moist.     Pharynx: No oropharyngeal exudate or posterior oropharyngeal erythema.  Eyes:     Conjunctiva/sclera: Conjunctivae normal.  Cardiovascular:     Rate and Rhythm: Normal rate and regular rhythm.     Heart sounds: Normal heart sounds. No murmur heard. Pulmonary:     Effort: Pulmonary effort is normal. No respiratory distress.     Breath sounds: Normal breath sounds. No wheezing, rhonchi or rales.  Skin:    General: Skin is warm and dry.  Neurological:     Mental Status: She is alert.  Psychiatric:        Mood and Affect: Mood normal.        Thought Content: Thought content normal.     UC Treatments / Results  Labs (all labs ordered are listed, but only abnormal results are displayed) Labs Reviewed - No data to display  EKG   Radiology DG Chest 2 View  Result Date: 02/21/2021 CLINICAL DATA:  Cough and wheezing EXAM: CHEST - 2 VIEW COMPARISON:  04/21/2015 FINDINGS: Midline trachea. Mild to moderate cardiomegaly. Mediastinal contours otherwise within normal limits. No pleural effusion or pneumothorax. No congestive failure. Clear lungs. IMPRESSION: Cardiomegaly without congestive failure. Electronically Signed   By: Abigail Miyamoto M.D.   On: 02/21/2021 14:52    Procedures Procedures (including critical care time)  Medications Ordered in UC Medications - No data to display  Initial Impression / Assessment and Plan / UC Course  I have reviewed the triage vital signs and the nursing notes.  Pertinent labs & imaging results that were available during my care of the patient were reviewed by me and considered in my medical decision making (see chart for details).    Chest x-ray without acute pulmonary finding.   Cardiomegaly noted but was also present on prior x-ray.  We will trial prednisone for bronchitis treatment.  Encouraged follow-up if  symptoms fail to improve or worsen.  Final Clinical Impressions(s) / UC Diagnoses   Final diagnoses:  Acute bronchitis, unspecified organism   Discharge Instructions   None    ED Prescriptions     Medication Sig Dispense Auth. Provider   predniSONE (DELTASONE) 20 MG tablet Take 2 tablets (40 mg total) by mouth daily with breakfast for 5 days. 10 tablet Francene Finders, PA-C      PDMP not reviewed this encounter.   Francene Finders, PA-C 02/22/21 [phone]

## 2021-02-21 NOTE — ED Triage Notes (Signed)
2 week h/o productive cough with some intermittent wheezing. Has been taking tussin, promethazine-DM and alka seltzer with some relief. Denies sob.

## 2021-02-22 ENCOUNTER — Encounter: Payer: Self-pay | Admitting: Physician Assistant

## 2021-07-05 ENCOUNTER — Other Ambulatory Visit (INDEPENDENT_AMBULATORY_CARE_PROVIDER_SITE_OTHER): Payer: Self-pay | Admitting: Primary Care

## 2021-07-05 DIAGNOSIS — I10 Essential (primary) hypertension: Secondary | ICD-10-CM

## 2021-07-05 NOTE — Telephone Encounter (Signed)
Medication Refill - Medication: amLODipine (NORVASC) 10 MG tablet and losartan-hydrochlorothiazide (HYZAAR) 100-25 MG tablet  Has the patient contacted their pharmacy? Yes.    ((Agent: If yes, when and what did the pharmacy advise?) Contact PCP office. Patient scheduled a physical for 07/13/2021 and would like a short supply to hold her over until appointment.   Preferred Pharmacy (with phone number or street name):  [facility] [address] Phone:  [phone]  Fax:  [phone]      Has the patient been seen for an appointment in the last year OR does the patient have an upcoming appointment? Yes.    Agent: Please be advised that RX refills may take up to 3 business days. We ask that you follow-up with your pharmacy.

## 2021-07-06 NOTE — Telephone Encounter (Signed)
Routed to PCP 

## 2021-07-06 NOTE — Telephone Encounter (Signed)
Requested medication (s) are due for refill today: Yes  Requested medication (s) are on the active medication list: Yes  Last refill:  05/14/20  Future visit scheduled: Yes  Notes to clinic:  Prescriptions have expired.    Requested Prescriptions  Pending Prescriptions Disp Refills   amLODipine (NORVASC) 10 MG tablet 90 tablet 3    Sig: Take 1 tablet (10 mg total) by mouth daily.     Cardiovascular: Calcium Channel Blockers 2 Failed - 07/05/2021  2:48 PM      Failed - Valid encounter within last 6 months    Recent Outpatient Visits           6 months ago Cough, unspecified type   [place], Michelle P, NP   6 months ago Morbid obesity ([place])   [place] Juluis Mire P, NP   9 months ago Class 3 severe obesity due to excess calories with serious comorbidity and body mass index (BMI) of 45.0 to 49.9 in adult [facility])   [place] Fenton Foy, NP   1 year ago Screening for malignant neoplasm of cervix   [place] Kerin Perna, NP   1 year ago Screening for diabetes mellitus   [place], Michelle P, NP       Future Appointments             In 1 week Kerin Perna, NP [place] BP in normal range    BP Readings from Last 1 Encounters:  02/21/21 128/87         Passed - Last Heart Rate in normal range    Pulse Readings from Last 1 Encounters:  02/21/21 94          losartan-hydrochlorothiazide (HYZAAR) 100-25 MG tablet 90 tablet 3    Sig: Take 1 tablet by mouth daily.     Cardiovascular: ARB + Diuretic Combos Failed - 07/05/2021  2:48 PM      Failed - K in normal range and within 180 days    Potassium  Date Value Ref Range Status  05/14/2020 3.8 3.5 - 5.2 mmol/L Final         Failed - Na in normal range and within 180 days    Sodium  Date Value Ref  Range Status  05/14/2020 145 (H) 134 - 144 mmol/L Final         Failed - Cr in normal range and within 180 days    Creatinine, Ser  Date Value Ref Range Status  05/14/2020 1.08 (H) 0.57 - 1.00 mg/dL Final         Failed - eGFR is 10 or above and within 180 days    eGFR  Date Value Ref Range Status  05/14/2020 60 >59 mL/min/1.73 Final         Failed - Valid encounter within last 6 months    Recent Outpatient Visits           6 months ago Cough, unspecified type   [place], Michelle P, NP   6 months ago Morbid obesity ([place])   [place] Kerin Perna, NP   9 months ago Class 3 severe obesity due to excess calories with serious comorbidity and body mass index (BMI) of 45.0  to 49.9 in adult [facility])   [place] Fenton Foy, NP   1 year ago Screening for malignant neoplasm of cervix   [place] Kerin Perna, NP   1 year ago Screening for diabetes mellitus   [place], [place], NP       Future Appointments             In 1 week Kerin Perna, NP [place] - Patient is not pregnant      Passed - Last BP in normal range    BP Readings from Last 1 Encounters:  02/21/21 128/87

## 2021-07-08 MED ORDER — AMLODIPINE BESYLATE 10 MG PO TABS: 10.0000 mg | ORAL_TABLET | Freq: Every day | ORAL | 1 refills | Status: DC

## 2021-07-08 MED ORDER — LOSARTAN POTASSIUM-HCTZ 100-25 MG PO TABS
1.0000 | ORAL_TABLET | Freq: Every day | ORAL | 1 refills | Status: DC
Start: 2021-07-08 — End: 2021-07-13

## 2021-07-08 NOTE — Telephone Encounter (Signed)
Needs appt

## 2021-07-13 ENCOUNTER — Encounter (INDEPENDENT_AMBULATORY_CARE_PROVIDER_SITE_OTHER): Payer: Self-pay | Admitting: Primary Care

## 2021-07-13 ENCOUNTER — Ambulatory Visit (INDEPENDENT_AMBULATORY_CARE_PROVIDER_SITE_OTHER): Payer: 59 | Admitting: Primary Care

## 2021-07-13 VITALS — BP 140/90 | HR 84 | Temp 98.6°F | Ht 64.0 in | Wt 273.6 lb

## 2021-07-13 DIAGNOSIS — I10 Essential (primary) hypertension: Secondary | ICD-10-CM

## 2021-07-13 DIAGNOSIS — R7303 Prediabetes: Secondary | ICD-10-CM

## 2021-07-13 DIAGNOSIS — E0800 Diabetes mellitus due to underlying condition with hyperosmolarity without nonketotic hyperglycemic-hyperosmolar coma (NKHHC): Secondary | ICD-10-CM

## 2021-07-13 DIAGNOSIS — Z0000 Encounter for general adult medical examination without abnormal findings: Secondary | ICD-10-CM

## 2021-07-13 DIAGNOSIS — Z1239 Encounter for other screening for malignant neoplasm of breast: Secondary | ICD-10-CM

## 2021-07-13 DIAGNOSIS — Z760 Encounter for issue of repeat prescription: Secondary | ICD-10-CM

## 2021-07-13 LAB — POCT GLYCOSYLATED HEMOGLOBIN (HGB A1C): Hemoglobin A1C: 6.6 % — AB (ref 4.0–5.6)

## 2021-07-13 MED ORDER — LOSARTAN POTASSIUM-HCTZ 100-25 MG PO TABS: 1.0000 | ORAL_TABLET | Freq: Every day | ORAL | 1 refills | Status: DC

## 2021-07-13 MED ORDER — AMLODIPINE BESYLATE 10 MG PO TABS
10.0000 mg | ORAL_TABLET | Freq: Every day | ORAL | 1 refills | Status: DC
Start: 2021-07-13 — End: 2022-04-10

## 2021-07-13 NOTE — Progress Notes (Signed)
[place]        Patient presents to clinic today for annual exam.  Patient is fasting for labs.  Acute Concerns: none  Chronic Issues: HTN and weight  Health Maintenance: Immunizations -- UTD Colon Cancer Screening --10/22/20 Mammogram -- 2/18 PAP -- 06/02/20 [place] -- postpone  HIV/Hep C Screening -- none  Past Medical History:  Diagnosis Date   GERD (gastroesophageal reflux disease)    with certain foods   Hypertension    on meds   Sleep apnea    uses CPAP    Past Surgical History:  Procedure Laterality Date   TUBAL LIGATION  1996   WISDOM TOOTH EXTRACTION      Current Outpatient Medications on File Prior to Visit  Medication Sig Dispense Refill   amLODipine (NORVASC) 10 MG tablet Take 1 tablet (10 mg total) by mouth daily. 30 tablet 1   BLACK CURRANT SEED OIL PO Take 1 Dose by mouth daily at 6 (six) AM.     Cholecalciferol (VITAMIN D3) 25 MCG/SPRAY LIQD Take 4 sprays by mouth daily at 6 (six) AM.     Flax OIL Take 1,000 mg by mouth daily at 6 (six) AM.     losartan-hydrochlorothiazide (HYZAAR) 100-25 MG tablet Take 1 tablet by mouth daily. 30 tablet 1   UBIQUINOL PO Take 100 mg by mouth daily at 6 (six) AM.     [DISCONTINUED] LISINOPRIL PO Take by mouth.     No current facility-administered medications on file prior to visit.    No Known Allergies  Family History  Problem Relation Age of Onset   Colon polyps Brother 86   Colon cancer Maternal Uncle    Esophageal cancer Neg Hx    Rectal cancer Neg Hx    Stomach cancer Neg Hx     Social History   Socioeconomic History   Marital status: Single    Spouse name: Not on file   Number of children: Not on file   Years of education: Not on file   Highest education level: Not on file  Occupational History   Not on file  Tobacco Use   Smoking status: Never   Smokeless tobacco: Never  Vaping Use   Vaping Use: Never used  Substance and Sexual Activity   Alcohol use: No    Drug use: Never   Sexual activity: Yes  Other Topics Concern   Not on file  Social History Narrative   Not on file   Social Determinants of Health   Financial Resource Strain: Not on file  Food Insecurity: Not on file  Transportation Needs: Not on file  Physical Activity: Not on file  Stress: Not on file  Social Connections: Not on file  Intimate Partner Violence: Not on file    ROS Comprehensive ROS Pertinent positive and negative noted in HPI   BP 140/90   Pulse 84   Temp 98.6 F (37 C) (Oral)   Ht 5' 4"  (1.626 m)   Wt 273 lb 9.6 oz (124.1 kg)   SpO2 100%   BMI 46.96 kg/m   Physical Exam Vitals reviewed.  Constitutional:      Appearance: She is obese.     Comments: morbid  HENT:     Right Ear: Tympanic membrane and external ear normal.     Left Ear: Tympanic membrane and external ear normal.     Nose: Nose normal.  Eyes:     Extraocular Movements: Extraocular movements intact.  Pupils: Pupils are equal, round, and reactive to light.  Cardiovascular:     Rate and Rhythm: Normal rate and regular rhythm.  Pulmonary:     Effort: Pulmonary effort is normal.     Breath sounds: Normal breath sounds.  Abdominal:     General: Abdomen is flat. Bowel sounds are normal.     Palpations: Abdomen is soft.  Musculoskeletal:        General: Normal range of motion.     Cervical back: Normal range of motion and neck supple.  Skin:    General: Skin is warm and dry.  Neurological:     Mental Status: She is alert and oriented to person, place, and time.  Psychiatric:        Mood and Affect: Mood normal.        Behavior: Behavior normal.        Thought Content: Thought content normal.        Judgment: Judgment normal.   Recent Results (from the past 2160 hour(s))  HgB A1c     Status: Abnormal   Collection Time: 07/13/21 10:28 AM  Result Value Ref Range   Hemoglobin A1C 6.6 (A) 4.0 - 5.6 %   HbA1c POC (<> result, manual entry)     HbA1c, POC (prediabetic range)      HbA1c, POC (controlled diabetic range)      Assessment/Plan: [place] was seen today for annual exam.  Diagnoses and all orders for this visit:  Prediabetes -     HgB A1c 6.6  Visit for annual health examination completed  Essential hypertension BP goal - < 130/80 Explained that having normal blood pressure is the goal and medications are helping to get to goal and maintain normal blood pressure. DIET: Limit salt intake, read nutrition labels to check salt content, limit fried and high fatty foods  Avoid using multisymptom OTC cold preparations that generally contain sudafed which can rise BP. Consult with pharmacist on best cold relief products to use for persons with HTN EXERCISE Discussed incorporating exercise such as walking - 30 minutes most days of the week and can do in 10 minute intervals     Medication refill -     losartan-hydrochlorothiazide (HYZAAR) 100-25 MG tablet; Take 1 tablet by mouth daily. -     amLODipine (NORVASC) 10 MG tablet; Take 1 tablet (10 mg total) by mouth daily.  Diabetes mellitus due to underlying condition with hyperosmolarity without coma, without long-term current use of insulin (HCC) New dx A1C 6.6 agreed upon no medication but life style medication for diet modification  Your A1C is a measure of your sugar over the past 3 months and is not affected by what you have eaten over the past few days. Diabetes increases your chances of stroke and heart attack over 300 % and is the leading cause of blindness and kidney failure in the [place]. Please make sure you decrease bad carbs like white bread, white rice, potatoes, corn, soft drinks, pasta, cereals, refined sugars, sweet tea, dried fruits, and fruit juice. Good carbs are okay to eat in moderation like sweet potatoes, brown rice, whole grain pasta/bread, most fruit (except dried fruit) and you can eat as many veggies as you want.   Greater than 6.5 is considered diabetic. Between 6.4 and 5.7 is  prediabetic If your A1C is less than 5.7 you are NOT diabetic.  Targets for Glucose Readings: Time of Check Target for patients WITHOUT Diabetes Target for DIABETICS  Before Meals  Less than 100  less than 150  Two hours after meals Less than 200  Less than 250    -     CBC with Differential -     CMP14+EGFR -     Lipid Panel  Breast screening -     MM DIGITAL SCREENING BILATERAL; Future      This visit occurred during the SARS-CoV-2 public health emergency.  Safety protocols were in place, including screening questions prior to the visit, additional usage of staff PPE, and extensive cleaning of exam room while observing appropriate contact time as indicated for disinfecting solutions.    This note has been created with Surveyor, quantity. Any transcriptional errors are unintentional.   Kerin Perna, NP 07/13/2021, 10:34 AM

## 2021-07-13 NOTE — Patient Instructions (Signed)

## 2021-07-14 ENCOUNTER — Telehealth (INDEPENDENT_AMBULATORY_CARE_PROVIDER_SITE_OTHER): Payer: Self-pay

## 2021-07-14 LAB — CBC WITH DIFFERENTIAL/PLATELET
Basophils Absolute: 0 10*3/uL (ref 0.0–0.2)
Basos: 0 %
EOS (ABSOLUTE): 0.2 10*3/uL (ref 0.0–0.4)
Eos: 5 %
Hematocrit: 43.9 % (ref 34.0–46.6)
Hemoglobin: 14.2 g/dL (ref 11.1–15.9)
Immature Grans (Abs): 0 10*3/uL (ref 0.0–0.1)
Immature Granulocytes: 0 %
Lymphocytes Absolute: 2.5 10*3/uL (ref 0.7–3.1)
Lymphs: 53 %
MCH: 28.8 pg (ref 26.6–33.0)
MCHC: 32.3 g/dL (ref 31.5–35.7)
MCV: 89 fL (ref 79–97)
Monocytes Absolute: 0.3 10*3/uL (ref 0.1–0.9)
Monocytes: 7 %
Neutrophils Absolute: 1.6 10*3/uL (ref 1.4–7.0)
Neutrophils: 35 %
Platelets: 253 10*3/uL (ref 150–450)
RBC: 4.93 x10E6/uL (ref 3.77–5.28)
RDW: 12.9 % (ref 11.7–15.4)
WBC: 4.7 10*3/uL (ref 3.4–10.8)

## 2021-07-14 LAB — CMP14+EGFR
ALT: 17 IU/L (ref 0–32)
AST: 21 IU/L (ref 0–40)
Albumin/Globulin Ratio: 1.1 — ABNORMAL LOW (ref 1.2–2.2)
Albumin: 4.2 g/dL (ref 3.8–4.9)
Alkaline Phosphatase: 58 IU/L (ref 44–121)
BUN/Creatinine Ratio: 16 (ref 9–23)
BUN: 17 mg/dL (ref 6–24)
Bilirubin Total: 0.4 mg/dL (ref 0.0–1.2)
CO2: 25 mmol/L (ref 20–29)
Calcium: 9.3 mg/dL (ref 8.7–10.2)
Chloride: 108 mmol/L — ABNORMAL HIGH (ref 96–106)
Creatinine, Ser: 1.07 mg/dL — ABNORMAL HIGH (ref 0.57–1.00)
Globulin, Total: 3.7 g/dL (ref 1.5–4.5)
Glucose: 119 mg/dL — ABNORMAL HIGH (ref 70–99)
Potassium: 3.6 mmol/L (ref 3.5–5.2)
Sodium: 147 mmol/L — ABNORMAL HIGH (ref 134–144)
Total Protein: 7.9 g/dL (ref 6.0–8.5)
eGFR: 61 mL/min/{1.73_m2} (ref >59)

## 2021-07-14 LAB — LIPID PANEL
Chol/HDL Ratio: 3.5 ratio (ref 0.0–4.4)
Cholesterol, Total: 142 mg/dL (ref 100–199)
HDL: 41 mg/dL (ref >39)
LDL Chol Calc (NIH): 81 mg/dL (ref 0–99)
Triglycerides: 110 mg/dL (ref 0–149)
VLDL Cholesterol Cal: 20 mg/dL (ref 5–40)

## 2021-07-14 NOTE — Telephone Encounter (Signed)
Per DPR left detailed message notifying patient of normal labs. Nat Christen, CMA

## 2021-07-14 NOTE — Telephone Encounter (Signed)
-----   Message from Kerin Perna, NP sent at 07/14/2021 10:30 AM EDT ----- Labs are  normal. Try to drink at least 48 oz of water per day. Work on eating a low fat, heart healthy diet and participate in regular aerobic exercise program to control as well. Exercise at least  30 minutes per day-5 days per week. Monitor eating red meat, fried foods,  junk foods, sodas, sugary foods or drinks, unhealthy snacking, alcohol or smoking.

## 2021-10-13 ENCOUNTER — Encounter (INDEPENDENT_AMBULATORY_CARE_PROVIDER_SITE_OTHER): Payer: Self-pay | Admitting: Primary Care

## 2021-10-13 ENCOUNTER — Ambulatory Visit (INDEPENDENT_AMBULATORY_CARE_PROVIDER_SITE_OTHER): Payer: Commercial Managed Care - HMO | Admitting: Primary Care

## 2021-10-13 VITALS — BP 140/86 | HR 88 | Temp 98.9°F | Ht 64.0 in | Wt 261.6 lb

## 2021-10-13 DIAGNOSIS — Z23 Encounter for immunization: Secondary | ICD-10-CM

## 2021-10-13 DIAGNOSIS — F430 Acute stress reaction: Secondary | ICD-10-CM | POA: Diagnosis not present

## 2021-10-13 DIAGNOSIS — I10 Essential (primary) hypertension: Secondary | ICD-10-CM

## 2021-10-13 DIAGNOSIS — Z6841 Body Mass Index (BMI) 40.0 and over, adult: Secondary | ICD-10-CM

## 2021-10-13 DIAGNOSIS — F411 Generalized anxiety disorder: Secondary | ICD-10-CM

## 2021-10-13 DIAGNOSIS — R059 Cough, unspecified: Secondary | ICD-10-CM | POA: Diagnosis not present

## 2021-10-13 DIAGNOSIS — Z1231 Encounter for screening mammogram for malignant neoplasm of breast: Secondary | ICD-10-CM

## 2021-10-13 MED ORDER — BENZONATATE 100 MG PO CAPS: 100.0000 mg | ORAL_CAPSULE | Freq: Two times a day (BID) | ORAL | 0 refills | Status: DC | PRN

## 2021-10-13 MED ORDER — LORATADINE 10 MG PO TABS: 10.0000 mg | ORAL_TABLET | Freq: Every day | ORAL | 2 refills | Status: DC

## 2021-10-13 NOTE — Patient Instructions (Signed)
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
## 2021-10-14 ENCOUNTER — Ambulatory Visit (INDEPENDENT_AMBULATORY_CARE_PROVIDER_SITE_OTHER): Payer: 59 | Admitting: Primary Care

## 2022-01-16 ENCOUNTER — Ambulatory Visit (INDEPENDENT_AMBULATORY_CARE_PROVIDER_SITE_OTHER): Payer: Self-pay | Admitting: Primary Care

## 2022-01-18 ENCOUNTER — Ambulatory Visit (INDEPENDENT_AMBULATORY_CARE_PROVIDER_SITE_OTHER): Payer: Commercial Managed Care - HMO | Admitting: Primary Care

## 2022-01-18 ENCOUNTER — Encounter (INDEPENDENT_AMBULATORY_CARE_PROVIDER_SITE_OTHER): Payer: Self-pay | Admitting: Primary Care

## 2022-01-18 VITALS — BP 150/90 | HR 87 | Resp 16 | Ht 65.0 in | Wt 262.8 lb

## 2022-01-18 DIAGNOSIS — R7303 Prediabetes: Secondary | ICD-10-CM | POA: Diagnosis not present

## 2022-01-18 DIAGNOSIS — I10 Essential (primary) hypertension: Secondary | ICD-10-CM | POA: Diagnosis not present

## 2022-01-18 LAB — POCT GLYCOSYLATED HEMOGLOBIN (HGB A1C): HbA1c, POC (prediabetic range): 6.4 % (ref 5.7–6.4)

## 2022-01-18 NOTE — Progress Notes (Signed)
[place], is a 56 y.o. female  BDZ:[id number]  [id number]  DOB - [birth year]  Chief Complaint  Patient presents with   Hypertension   Prediabetes       Subjective:   Laurie Buckley is a 56 y.o. female here today for a follow up visit.  The management of hypertension.  Patient has No headache, No chest pain, No abdominal pain - No Nausea, No new weakness tingling or numbness, No Cough - shortness of breath.  Follow-up on prediabetes she denies polyuria, polydipsia, polyphasia or vision changes.  No problems updated.  No Known Allergies  Past Medical History:  Diagnosis Date   GERD (gastroesophageal reflux disease)    with certain foods   Hypertension    on meds   Sleep apnea    uses CPAP    Current Outpatient Medications on File Prior to Visit  Medication Sig Dispense Refill   amLODipine (NORVASC) 10 MG tablet Take 1 tablet (10 mg total) by mouth daily. 90 tablet 1   benzonatate (TESSALON) 100 MG capsule Take 1 capsule (100 mg total) by mouth 2 (two) times daily as needed for cough. 20 capsule 0   BLACK CURRANT SEED OIL PO Take 1 Dose by mouth daily at 6 (six) AM.     Cholecalciferol (VITAMIN D3) 25 MCG/SPRAY LIQD Take 4 sprays by mouth daily at 6 (six) AM.     Flax OIL Take 1,000 mg by mouth daily at 6 (six) AM.     loratadine (CLARITIN) 10 MG tablet Take 1 tablet (10 mg total) by mouth daily. 90 tablet 2   losartan-hydrochlorothiazide (HYZAAR) 100-25 MG tablet Take 1 tablet by mouth daily. 90 tablet 1   UBIQUINOL PO Take 100 mg by mouth daily at 6 (six) AM.     [DISCONTINUED] LISINOPRIL PO Take by mouth.     No current facility-administered medications on file prior to visit.    Objective:   Vitals:   01/18/22 1633 01/18/22 1655  BP: (Abnormal) 145/91 (Abnormal) 150/90  Pulse: 87   Resp: 16   SpO2: 95%   Weight: 262 lb 12.8 oz (119.2 kg)   Height: '5\' 5"'$  (1.651 m)     Exam General appearance : Awake, alert, not in any  distress. Speech Clear. Not toxic looking HEENT: Atraumatic and Normocephalic, pupils equally reactive to light and accomodation Neck: Supple, no JVD. No cervical lymphadenopathy.  Chest: Good air entry bilaterally, no added sounds  CVS: S1 S2 regular, no murmurs.  Abdomen: Bowel sounds present, Non tender and not distended with no gaurding, rigidity or rebound. Extremities: B/L Lower Ext shows no edema, both legs are warm to touch Neurology: Awake alert, and oriented X 3, CN II-XII intact, Non focal Skin: No Rash  Data Review Lab Results  Component Value Date   HGBA1C 6.4 01/18/2022   HGBA1C 6.6 (A) 07/13/2021   HGBA1C 6.1 (A) 05/14/2020    Assessment & Plan   1. type 2 diabetes A1c previously 6.6 today 6.4 improvement - POCT glycosylated hemoglobin (Hb A1C) - educated on lifestyle modifications, including but not limited to diet choices and adding exercise to daily routine.    2. Essential hypertension BP goal - < 130/80 Explained that having normal blood pressure is the goal and medications are helping to get to goal and maintain normal blood pressure. DIET: Limit salt intake, read nutrition labels to check salt content, limit fried and high fatty foods  Avoid using multisymptom OTC cold preparations that  generally contain sudafed which can rise BP. Consult with pharmacist on best cold relief products to use for persons with HTN EXERCISE Discussed incorporating exercise such as walking - 30 minutes most days of the week and can do in 10 minute intervals    Reviewed Refills questionable if taking daily blood pressure medication   Patient have been counseled extensively about nutrition and exercise. Other issues discussed during this visit include: low cholesterol diet, weight control and daily exercise, foot care, annual eye examinations at Ophthalmology, importance of adherence with medications and regular follow-up. We also discussed long term complications of uncontrolled  diabetes and hypertension.   Return in about 4 weeks (around 02/15/2022) for bp ck .  The patient was given clear instructions to go to ER or return to medical center if symptoms don't improve, worsen or new problems develop. The patient verbalized understanding. The patient was told to call to get lab results if they haven't heard anything in the next week.   This note has been created with Surveyor, quantity. Any transcriptional errors are unintentional.   Kerin Perna, NP 01/22/2022, 10:31 PM

## 2022-02-15 ENCOUNTER — Ambulatory Visit (INDEPENDENT_AMBULATORY_CARE_PROVIDER_SITE_OTHER): Payer: Commercial Managed Care - HMO

## 2022-02-22 ENCOUNTER — Ambulatory Visit (INDEPENDENT_AMBULATORY_CARE_PROVIDER_SITE_OTHER): Payer: Commercial Managed Care - HMO

## 2022-02-22 VITALS — BP 128/85

## 2022-02-22 DIAGNOSIS — Z0130 Encounter for examination of blood pressure without abnormal findings: Secondary | ICD-10-CM

## 2022-04-02 ENCOUNTER — Ambulatory Visit (HOSPITAL_COMMUNITY)
Admission: RE | Admit: 2022-04-02 | Discharge: 2022-04-02 | Disposition: A | Discharge status: Home / Self Care | Payer: Commercial Managed Care - HMO | Source: Ambulatory Visit | Attending: Family Medicine | Admitting: Family Medicine

## 2022-04-02 ENCOUNTER — Encounter (HOSPITAL_COMMUNITY): Payer: Self-pay

## 2022-04-02 ENCOUNTER — Other Ambulatory Visit: Payer: Self-pay

## 2022-04-02 VITALS — BP 106/75 | HR 92 | Temp 99.3°F | Resp 20

## 2022-04-02 DIAGNOSIS — J019 Acute sinusitis, unspecified: Secondary | ICD-10-CM

## 2022-04-02 DIAGNOSIS — B9689 Other specified bacterial agents as the cause of diseases classified elsewhere: Secondary | ICD-10-CM

## 2022-04-02 LAB — POCT URINALYSIS DIPSTICK, ED / UC
Bilirubin Urine: NEGATIVE
Glucose, UA: NEGATIVE mg/dL
Hgb urine dipstick: NEGATIVE
Ketones, ur: NEGATIVE mg/dL
Leukocytes,Ua: NEGATIVE
Nitrite: NEGATIVE
Protein, ur: NEGATIVE mg/dL
Specific Gravity, Urine: 1.02 (ref 1.005–1.030)
Urobilinogen, UA: 0.2 mg/dL (ref 0.0–1.0)
pH: 7.5 (ref 5.0–8.0)

## 2022-04-02 MED ORDER — ALBUTEROL SULFATE HFA 108 (90 BASE) MCG/ACT IN AERS: 1.0000 | INHALATION_SPRAY | Freq: Four times a day (QID) | RESPIRATORY_TRACT | 0 refills | Status: DC | PRN

## 2022-04-02 MED ORDER — AMOXICILLIN 500 MG PO CAPS: 500.0000 mg | ORAL_CAPSULE | Freq: Two times a day (BID) | ORAL | 0 refills | Status: AC

## 2022-04-02 NOTE — ED Triage Notes (Addendum)
Symptoms started 03/24/2022,  complains of headache, congestion, cough, runny/stuffy nose No fever.   Phlegm is clear or yellowish phlegm.    Has taken saline nasal drops, otc cough medicine, teas/lemon.    Family member with similar symptoms.  Covid test this morning at home was negative  Saturday started feeling "heaviness " in lower abdomen.  Denies any burning with urination.  Denies any change in bathroom frequency

## 2022-04-02 NOTE — ED Provider Notes (Signed)
[place]    CSN: [id number] Arrival date & time: 04/02/22  1320      History   Chief Complaint Chief Complaint  Patient presents with   Appointment    13:30   URI    HPI Laurie Buckley is a 57 y.o. female.  Here with 9 day history of congestion, runny nose, sinus pressure, cough Clear/yellow phlegm. Sometimes hears wheezing No fevers Has tried nasal saline, OTC cough med Reports she has been improving but feels symptoms are lingering   Negative covid test this morning Family member with similar symptoms   Also reports "heavy" feeling in the abdomen last night. Resolved on its own. No symptoms today. Denies urinary, bowel, or vaginal symptoms   Past Medical History:  Diagnosis Date   GERD (gastroesophageal reflux disease)    with certain foods   Hypertension    on meds   Sleep apnea    uses CPAP    There are no problems to display for this patient.   Past Surgical History:  Procedure Laterality Date   TUBAL LIGATION  1996   WISDOM TOOTH EXTRACTION      OB History   No obstetric history on file.      Home Medications    Prior to Admission medications   Medication Sig Start Date End Date Taking? Authorizing Provider  albuterol (VENTOLIN HFA) 108 (90 Base) MCG/ACT inhaler Inhale 1 puff into the lungs every 6 (six) hours as needed for wheezing or shortness of breath. 04/02/22  Yes Adom Schoeneck, Wells Guiles, PA-C  amoxicillin (AMOXIL) 500 MG capsule Take 1 capsule (500 mg total) by mouth every 12 (twelve) hours for 5 days. 04/02/22 04/07/22 Yes Earlyn Sylvan, Wells Guiles, PA-C  amLODipine (NORVASC) 10 MG tablet Take 1 tablet (10 mg total) by mouth daily. 07/13/21   Kerin Perna, NP  Cholecalciferol (VITAMIN D3) 25 MCG/SPRAY LIQD Take 4 sprays by mouth daily at 6 (six) AM.    [provider]  loratadine (CLARITIN) 10 MG tablet Take 1 tablet (10 mg total) by mouth daily. 10/13/21   Kerin Perna, NP  losartan-hydrochlorothiazide (HYZAAR) 100-25 MG  tablet Take 1 tablet by mouth daily. 07/13/21   Kerin Perna, NP  UBIQUINOL PO Take 100 mg by mouth daily at 6 (six) AM.    [provider]  LISINOPRIL PO Take by mouth.  03/10/20  [provider]    Family History Family History  Problem Relation Age of Onset   Colon polyps Brother 83   Colon cancer Maternal Uncle    Esophageal cancer Neg Hx    Rectal cancer Neg Hx    Stomach cancer Neg Hx     Social History Social History   Tobacco Use   Smoking status: Never   Smokeless tobacco: Never  Vaping Use   Vaping Use: Never used  Substance Use Topics   Alcohol use: No   Drug use: Never     Allergies   Patient has no known allergies.   Review of Systems Review of Systems As per HPI  Physical Exam Triage Vital Signs ED Triage Vitals [04/02/22 1343]  Enc Vitals Group     BP      Pulse      Resp      Temp      Temp src      SpO2      Weight      Height      Head Circumference  Peak Flow      Pain Score 5     Pain Loc      Pain Edu?      Excl. in [place]?    No data found.  Updated Vital Signs BP 106/75 (BP Location: Left Arm)   Pulse 92   Temp 99.3 F (37.4 C) (Oral)   Resp 20   SpO2 95%    Physical Exam Vitals and nursing note reviewed.  Constitutional:      General: She is not in acute distress. HENT:     Right Ear: Tympanic membrane and ear canal normal.     Left Ear: Tympanic membrane and ear canal normal.     Nose: Congestion present. No rhinorrhea.     Right Turbinates: Swollen.     Left Turbinates: Swollen.     Right Sinus: No frontal sinus tenderness.     Left Sinus: No frontal sinus tenderness.     Mouth/Throat:     Mouth: Mucous membranes are moist.     Pharynx: Oropharynx is clear. No posterior oropharyngeal erythema.  Eyes:     Conjunctiva/sclera: Conjunctivae normal.  Cardiovascular:     Rate and Rhythm: Normal rate and regular rhythm.     Pulses: Normal pulses.     Heart sounds: Normal heart sounds.   Pulmonary:     Effort: Pulmonary effort is normal.     Breath sounds: Normal breath sounds.     Comments: Clear throughout. No wheezing. No respiratory distress. Speaks in full sentences  Abdominal:     General: There is no distension.     Palpations: There is no mass.     Tenderness: There is no abdominal tenderness. There is no guarding or rebound.  Musculoskeletal:     Cervical back: Normal range of motion.  Lymphadenopathy:     Cervical: No cervical adenopathy.  Skin:    General: Skin is warm and dry.  Neurological:     Mental Status: She is alert and oriented to person, place, and time.     UC Treatments / Results  Labs (all labs ordered are listed, but only abnormal results are displayed) Labs Reviewed  POCT URINALYSIS DIPSTICK, ED / UC    EKG   Radiology No results found.  Procedures Procedures (including critical care time)  Medications Ordered in UC Medications - No data to display  Initial Impression / Assessment and Plan / UC Course  I have reviewed the triage vital signs and the nursing notes.  Pertinent labs & imaging results that were available during my care of the patient were reviewed by me and considered in my medical decision making (see chart for details).  Afebrile, well appearing She is improving which is reassuring. Discussed cough can linger for several weeks. With 9 going on 10 days of symptoms will cover for acute bacterial sinusitis with amox BID x 5 days Recommend mucinex with lots of fluids Sent inhaler per patient request. Can use q6 hours prn  No indication for UA or further workup today given patients abd sensation resolved on its own. I recommend to monitor and return if needed. Patient agrees to plan  Final Clinical Impressions(s) / UC Diagnoses   Final diagnoses:  Acute bacterial sinusitis     Discharge Instructions      Please take medication as prescribed. Take with food to avoid upset stomach. Drink lots of  fluids!  I have sent inhaler as well. You can use this every 6 hours (3x daily) for  the next few days as needed  Please return with any concerns      ED Prescriptions     Medication Sig Dispense Auth. Provider   amoxicillin (AMOXIL) 500 MG capsule Take 1 capsule (500 mg total) by mouth every 12 (twelve) hours for 5 days. 10 capsule Elisha Mcgruder, PA-C   albuterol (VENTOLIN HFA) 108 (90 Base) MCG/ACT inhaler Inhale 1 puff into the lungs every 6 (six) hours as needed for wheezing or shortness of breath. 8 g Saryna Kneeland, Wells Guiles, PA-C      PDMP not reviewed this encounter.   [place], [place] 04/02/22 1422

## 2022-04-02 NOTE — Discharge Instructions (Signed)
Please take medication as prescribed. Take with food to avoid upset stomach. Drink lots of fluids!  I have sent inhaler as well. You can use this every 6 hours (3x daily) for the next few days as needed  Please return with any concerns

## 2022-04-10 ENCOUNTER — Ambulatory Visit (INDEPENDENT_AMBULATORY_CARE_PROVIDER_SITE_OTHER): Payer: Self-pay

## 2022-04-10 ENCOUNTER — Ambulatory Visit
Admission: EM | Admit: 2022-04-10 | Discharge: 2022-04-10 | Disposition: A | Discharge status: Home / Self Care | Payer: Commercial Managed Care - HMO | Attending: Internal Medicine | Admitting: Internal Medicine

## 2022-04-10 ENCOUNTER — Encounter: Payer: Self-pay | Admitting: Emergency Medicine

## 2022-04-10 ENCOUNTER — Other Ambulatory Visit (INDEPENDENT_AMBULATORY_CARE_PROVIDER_SITE_OTHER): Payer: Self-pay | Admitting: Primary Care

## 2022-04-10 DIAGNOSIS — R059 Cough, unspecified: Secondary | ICD-10-CM

## 2022-04-10 DIAGNOSIS — J069 Acute upper respiratory infection, unspecified: Secondary | ICD-10-CM | POA: Diagnosis not present

## 2022-04-10 DIAGNOSIS — R053 Chronic cough: Secondary | ICD-10-CM

## 2022-04-10 DIAGNOSIS — I10 Essential (primary) hypertension: Secondary | ICD-10-CM

## 2022-04-10 MED ORDER — PREDNISONE 20 MG PO TABS: 40.0000 mg | ORAL_TABLET | Freq: Every day | ORAL | 0 refills | Status: AC

## 2022-04-10 MED ORDER — BENZONATATE 100 MG PO CAPS: 100.0000 mg | ORAL_CAPSULE | Freq: Three times a day (TID) | ORAL | 0 refills | Status: DC | PRN

## 2022-04-10 NOTE — ED Provider Notes (Signed)
[facility]    CSN: [id number] Arrival date & time: 04/10/22  1428      History   Chief Complaint Chief Complaint  Patient presents with   Cough    HPI Laurie Buckley is a 57 y.o. female.   Patient presents with persistent nasal congestion and cough.  Patient was seen on 04/02/22 and was prescribed amoxicillin and albuterol inhaler.  Symptoms had been present for about 9 to 10 days at that previous visit.  Cough seemed to resolve but returned about 3 days ago. Patient reports that she completed amoxicillin but was not able to get albuterol inhaler at the pharmacy given that insurance would not cover it.  She reports that she has had some intermittent shortness of breath with exertion but denies chest pain.  Denies history of asthma or COPD.  Denies sore throat, nausea, vomiting, diarrhea, abdominal pain.  Patient does not smoke cigarettes.   Cough   Past Medical History:  Diagnosis Date   GERD (gastroesophageal reflux disease)    with certain foods   Hypertension    on meds   Sleep apnea    uses CPAP    There are no problems to display for this patient.   Past Surgical History:  Procedure Laterality Date   TUBAL LIGATION  1996   WISDOM TOOTH EXTRACTION      OB History   No obstetric history on file.      Home Medications    Prior to Admission medications   Medication Sig Start Date End Date Taking? Authorizing Provider  albuterol (VENTOLIN HFA) 108 (90 Base) MCG/ACT inhaler Inhale 1 puff into the lungs every 6 (six) hours as needed for wheezing or shortness of breath. 04/02/22  Yes Rising, Wells Guiles, PA-C  amLODipine (NORVASC) 10 MG tablet Take 1 tablet (10 mg total) by mouth daily. 07/13/21  Yes Kerin Perna, NP  benzonatate (TESSALON) 100 MG capsule Take 1 capsule (100 mg total) by mouth every 8 (eight) hours as needed for cough. 04/10/22  Yes Nyko Gell, Hildred Alamin E, FNP  Cholecalciferol (VITAMIN D3) 25 MCG/SPRAY LIQD Take 4 sprays by mouth daily at 6  (six) AM.   Yes [provider]  loratadine (CLARITIN) 10 MG tablet Take 1 tablet (10 mg total) by mouth daily. 10/13/21  Yes Kerin Perna, NP  losartan-hydrochlorothiazide (HYZAAR) 100-25 MG tablet Take 1 tablet by mouth daily. 07/13/21  Yes Kerin Perna, NP  predniSONE (DELTASONE) 20 MG tablet Take 2 tablets (40 mg total) by mouth daily for 5 days. 04/10/22 04/15/22 Yes Dashon Mcintire, Hildred Alamin E, FNP  UBIQUINOL PO Take 100 mg by mouth daily at 6 (six) AM.   Yes [provider]  LISINOPRIL PO Take by mouth.  03/10/20  [provider]    Family History Family History  Problem Relation Age of Onset   Colon polyps Brother 67   Colon cancer Maternal Uncle    Esophageal cancer Neg Hx    Rectal cancer Neg Hx    Stomach cancer Neg Hx     Social History Social History   Tobacco Use   Smoking status: Never   Smokeless tobacco: Never  Vaping Use   Vaping Use: Never used  Substance Use Topics   Alcohol use: No   Drug use: Never     Allergies   Patient has no known allergies.   Review of Systems Review of Systems Per HPI  Physical Exam Triage Vital Signs ED Triage Vitals  Enc Vitals  Group     BP 04/10/22 1531 137/86     Pulse Rate 04/10/22 1531 91     Resp 04/10/22 1531 18     Temp 04/10/22 1531 99.2 F (37.3 C)     Temp Source 04/10/22 1531 Oral     SpO2 04/10/22 1531 95 %     Weight 04/10/22 1533 270 lb (122.5 kg)     Height 04/10/22 1533 '5\' 5"'$  (1.651 m)     Head Circumference --      Peak Flow --      Pain Score 04/10/22 1533 0     Pain Loc --      Pain Edu? --      Excl. in [place]? --    No data found.  Updated Vital Signs BP 137/86 (BP Location: Right Arm)   Pulse 91   Temp 99.2 F (37.3 C) (Oral)   Resp 18   Ht '5\' 5"'$  (1.651 m)   Wt 270 lb (122.5 kg)   SpO2 95%   BMI 44.93 kg/m   Visual Acuity Right Eye Distance:   Left Eye Distance:   Bilateral Distance:    Right Eye Near:   Left Eye Near:    Bilateral Near:     Physical  Exam Constitutional:      General: She is not in acute distress.    Appearance: Normal appearance. She is not toxic-appearing or diaphoretic.  HENT:     Head: Normocephalic and atraumatic.     Right Ear: Tympanic membrane and ear canal normal.     Left Ear: Tympanic membrane and ear canal normal.     Nose: Congestion present.     Mouth/Throat:     Mouth: Mucous membranes are moist.     Pharynx: No posterior oropharyngeal erythema.  Eyes:     Extraocular Movements: Extraocular movements intact.     Conjunctiva/sclera: Conjunctivae normal.     Pupils: Pupils are equal, round, and reactive to light.  Cardiovascular:     Rate and Rhythm: Normal rate and regular rhythm.     Pulses: Normal pulses.     Heart sounds: Normal heart sounds.  Pulmonary:     Effort: Pulmonary effort is normal. No respiratory distress.     Breath sounds: No stridor. Wheezing present. No rhonchi or rales.     Comments: Very mild wheezing to auscultation. Abdominal:     General: Abdomen is flat. Bowel sounds are normal.     Palpations: Abdomen is soft.  Musculoskeletal:        General: Normal range of motion.     Cervical back: Normal range of motion.  Skin:    General: Skin is warm and dry.  Neurological:     General: No focal deficit present.     Mental Status: She is alert and oriented to person, place, and time. Mental status is at baseline.  Psychiatric:        Mood and Affect: Mood normal.        Behavior: Behavior normal.      UC Treatments / Results  Labs (all labs ordered are listed, but only abnormal results are displayed) Labs Reviewed - No data to display  EKG   Radiology No results found.  Procedures Procedures (including critical care time)  Medications Ordered in UC Medications - No data to display  Initial Impression / Assessment and Plan / UC Course  I have reviewed the triage vital signs and the nursing notes.  Pertinent labs &  imaging results that were available during  my care of the patient were reviewed by me and considered in my medical decision making (see chart for details).     Patient completed amoxicillin.  Suggested chest x-ray to patient.  Do not have x-ray tech here in urgent care today so patient will have to have outpatient chest imaging.  Patient declined chest x-ray today.  Suspect possible persistent viral illness as there are no obvious indications of bacterial infection on exam.  Possible bronchitis.  Will treat with prednisone as patient has taken this before and tolerated well.  No obvious contraindications noted to prednisone in patient's history.  Patient states that she is now able to pick albuterol inhaler at pharmacy so encouraged her to do so given mild wheezing noted on exam.  Cough medication also prescribed for patient.  Advised strict follow-up precautions if symptoms persist or worsen.  Patient verbalized understanding and was agreeable with plan. Final Clinical Impressions(s) / UC Diagnoses   Final diagnoses:  Acute upper respiratory infection  Persistent cough     Discharge Instructions      I have prescribed prednisone and a cough medication to alleviate upper respiratory infection and persistent cough.  Please follow-up if any symptoms persist or worsen.    ED Prescriptions     Medication Sig Dispense Auth. Provider   predniSONE (DELTASONE) 20 MG tablet Take 2 tablets (40 mg total) by mouth daily for 5 days. 10 tablet Correll, [place], [place]   benzonatate (TESSALON) 100 MG capsule Take 1 capsule (100 mg total) by mouth every 8 (eight) hours as needed for cough. 21 capsule [place], Michele Rockers, [place]      PDMP not reviewed this encounter.   Teodora Medici, [place] 04/10/22 [phone]

## 2022-04-10 NOTE — Telephone Encounter (Signed)
  Chief Complaint: Wheezing Symptoms: wheezing Frequency:  Pertinent Negatives: Patient denies SOB, fever Disposition: '[]'$ ED /'[x]'$ Urgent Care (no appt availability in office) / '[]'$ Appointment(In office/virtual)/ '[]'$  [facility]/ '[]'$ Home Care/ '[]'$ Refused Recommended Disposition /'[]'$ [facility]/ '[]'$  Follow-up with PCP Additional Notes: PT was seen at [facility] for acute bacterial sinusitis. PT was given ABX and ventolin. Pt started to get better, but pt is now wheezing and she can hear rattling in her chest. No appts in office. Called [facility] (no answer) and sent teams message ( no response) .  PT will go to UC for care.    Reason for Disposition  [1] MILD difficulty breathing (e.g., minimal/no SOB at rest, SOB with walking, pulse <100) AND [2] NEW-onset or WORSE than normal  Answer Assessment - Initial Assessment Questions 1. RESPIRATORY STATUS: "Describe your breathing?" (e.g., wheezing, shortness of breath, unable to speak, severe coughing)      Wheezing 2. ONSET: "When did this breathing problem begin?"      10 days 3. PATTERN "Does the difficult breathing come and go, or has it been constant since it started?"      Got a bit better - now worse 4. SEVERITY: "How bad is your breathing?" (e.g., mild, moderate, severe)    - MILD: No SOB at rest, mild SOB with walking, speaks normally in sentences, can lie down, no retractions, pulse < 100.    - MODERATE: SOB at rest, SOB with minimal exertion and prefers to sit, cannot lie down flat, speaks in phrases, mild retractions, audible wheezing, pulse 100-120.    - SEVERE: Very SOB at rest, speaks in single words, struggling to breathe, sitting hunched forward, retractions, pulse > 120      Mild 5. RECURRENT SYMPTOM: "Have you had difficulty breathing before?" If Yes, ask: "When was the last time?" and "What happened that time?"      Bronchitis 6. CARDIAC HISTORY: "Do you have any history of heart disease?" (e.g., heart attack, angina, bypass  surgery, angioplasty)      no 7. LUNG HISTORY: "Do you have any history of lung disease?"  (e.g., pulmonary embolus, asthma, emphysema)     Bronchitis 8. CAUSE: "What do you think is causing the breathing problem?"      Unsure 9. OTHER SYMPTOMS: "Do you have any other symptoms? (e.g., dizziness, runny nose, cough, chest pain, fever)     Cough, Runny nose 10. O2 SATURATION MONITOR:  "Do you use an oxygen saturation monitor (pulse oximeter) at home?" If Yes, ask: "What is your reading (oxygen level) today?" "What is your usual oxygen saturation reading?" (e.g., 95%)       na  Protocols used: Breathing Difficulty-A-AH

## 2022-04-10 NOTE — ED Triage Notes (Signed)
Patient c/o cough, congestion, wheezing for a couple of days.  Patient was treated with Amoxil on 04/02/2022 and completed course.  Unable to fill the Albuterol inhaler due to insurance.

## 2022-04-10 NOTE — Discharge Instructions (Signed)
I have prescribed prednisone and a cough medication to alleviate upper respiratory infection and persistent cough.  Please follow-up if any symptoms persist or worsen.

## 2022-04-19 ENCOUNTER — Ambulatory Visit (INDEPENDENT_AMBULATORY_CARE_PROVIDER_SITE_OTHER): Payer: Commercial Managed Care - HMO | Admitting: Primary Care

## 2022-04-19 ENCOUNTER — Telehealth (INDEPENDENT_AMBULATORY_CARE_PROVIDER_SITE_OTHER): Payer: Self-pay | Admitting: Primary Care

## 2022-04-19 ENCOUNTER — Telehealth (INDEPENDENT_AMBULATORY_CARE_PROVIDER_SITE_OTHER): Payer: Self-pay

## 2022-04-19 IMAGING — DX DG CHEST 2V
2 series · 2 of 2 positions shown · non-contrast
Comparison: 04/21/2015

CLINICAL DATA: Cough and wheezing

EXAM:
CHEST - 2 VIEW

[chest pa]
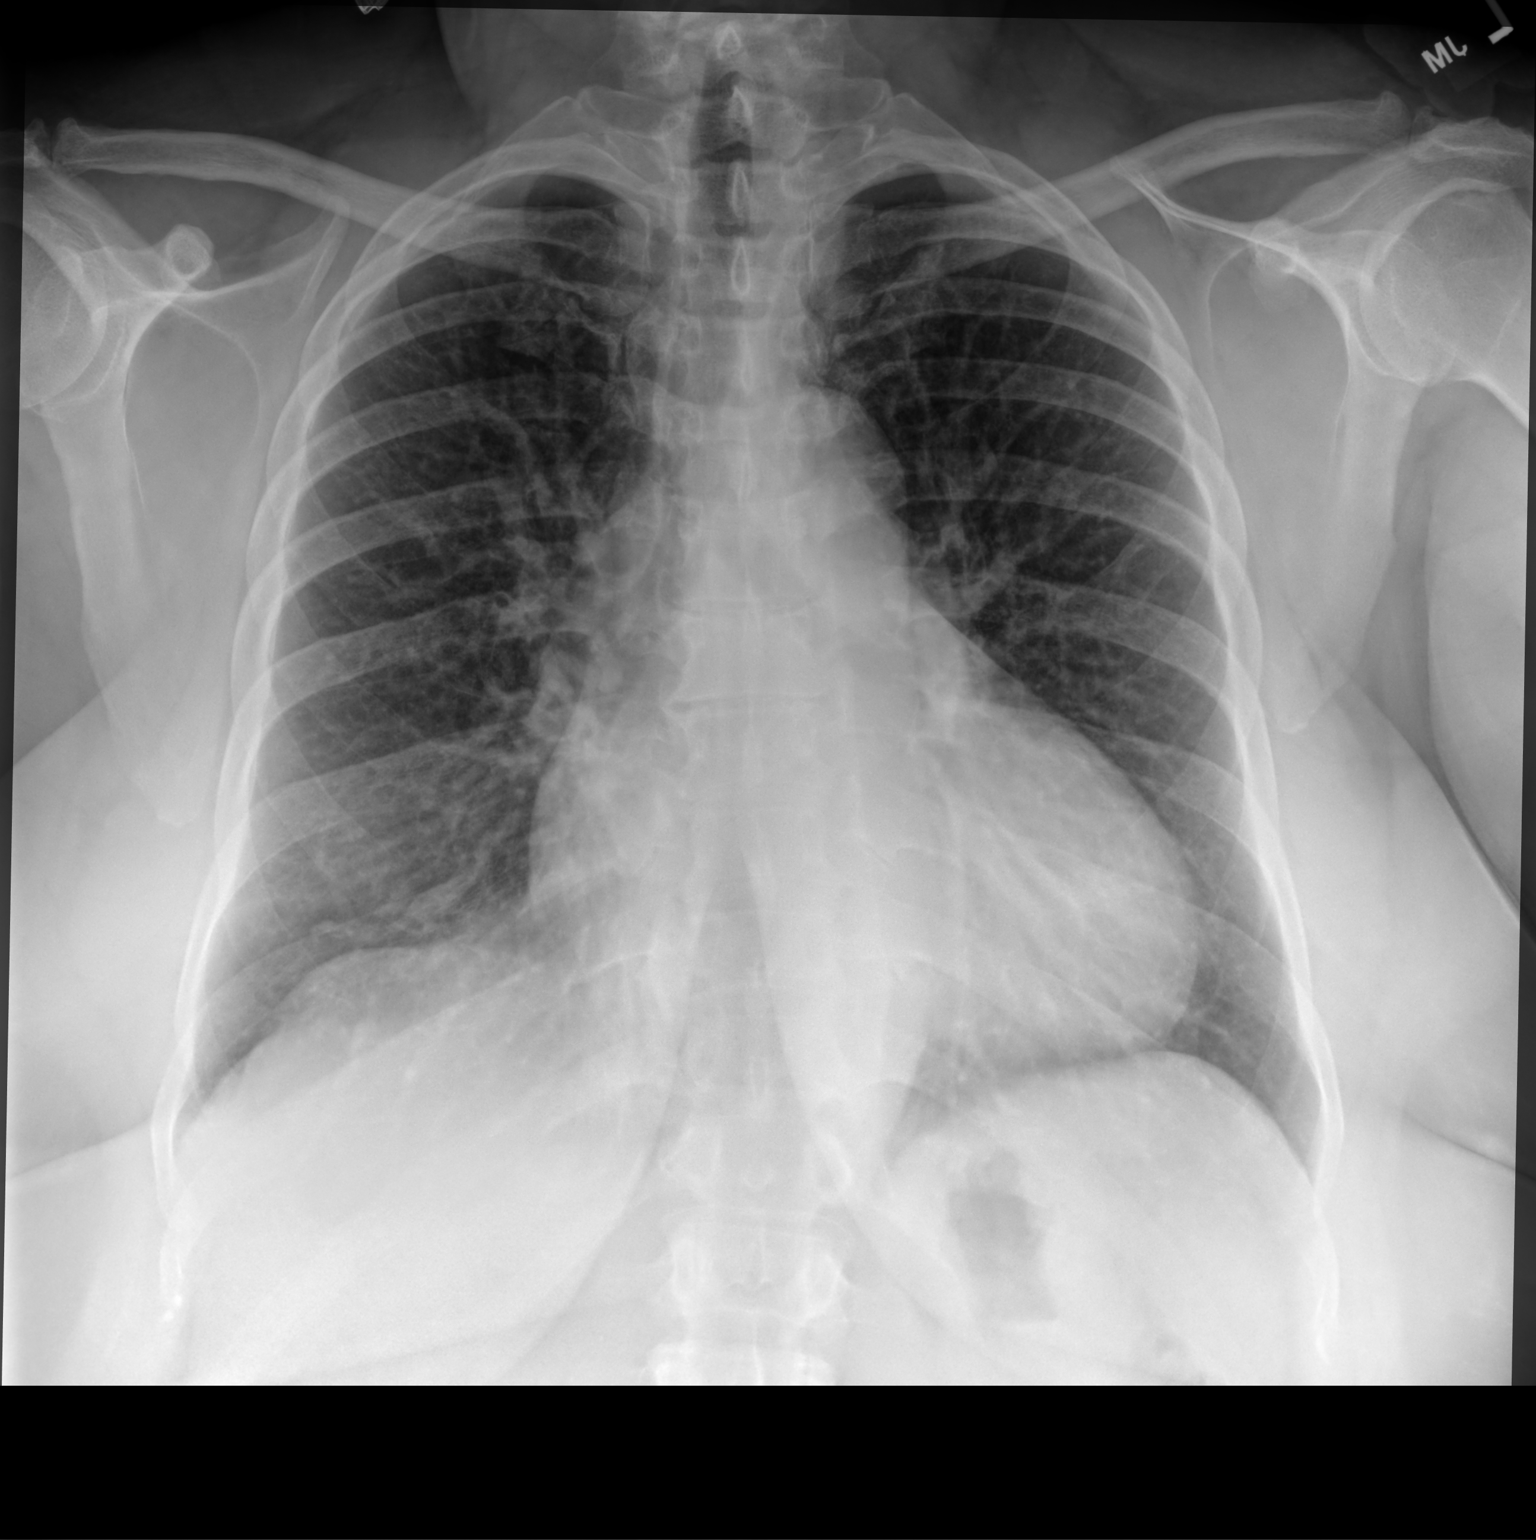

[chest lat]
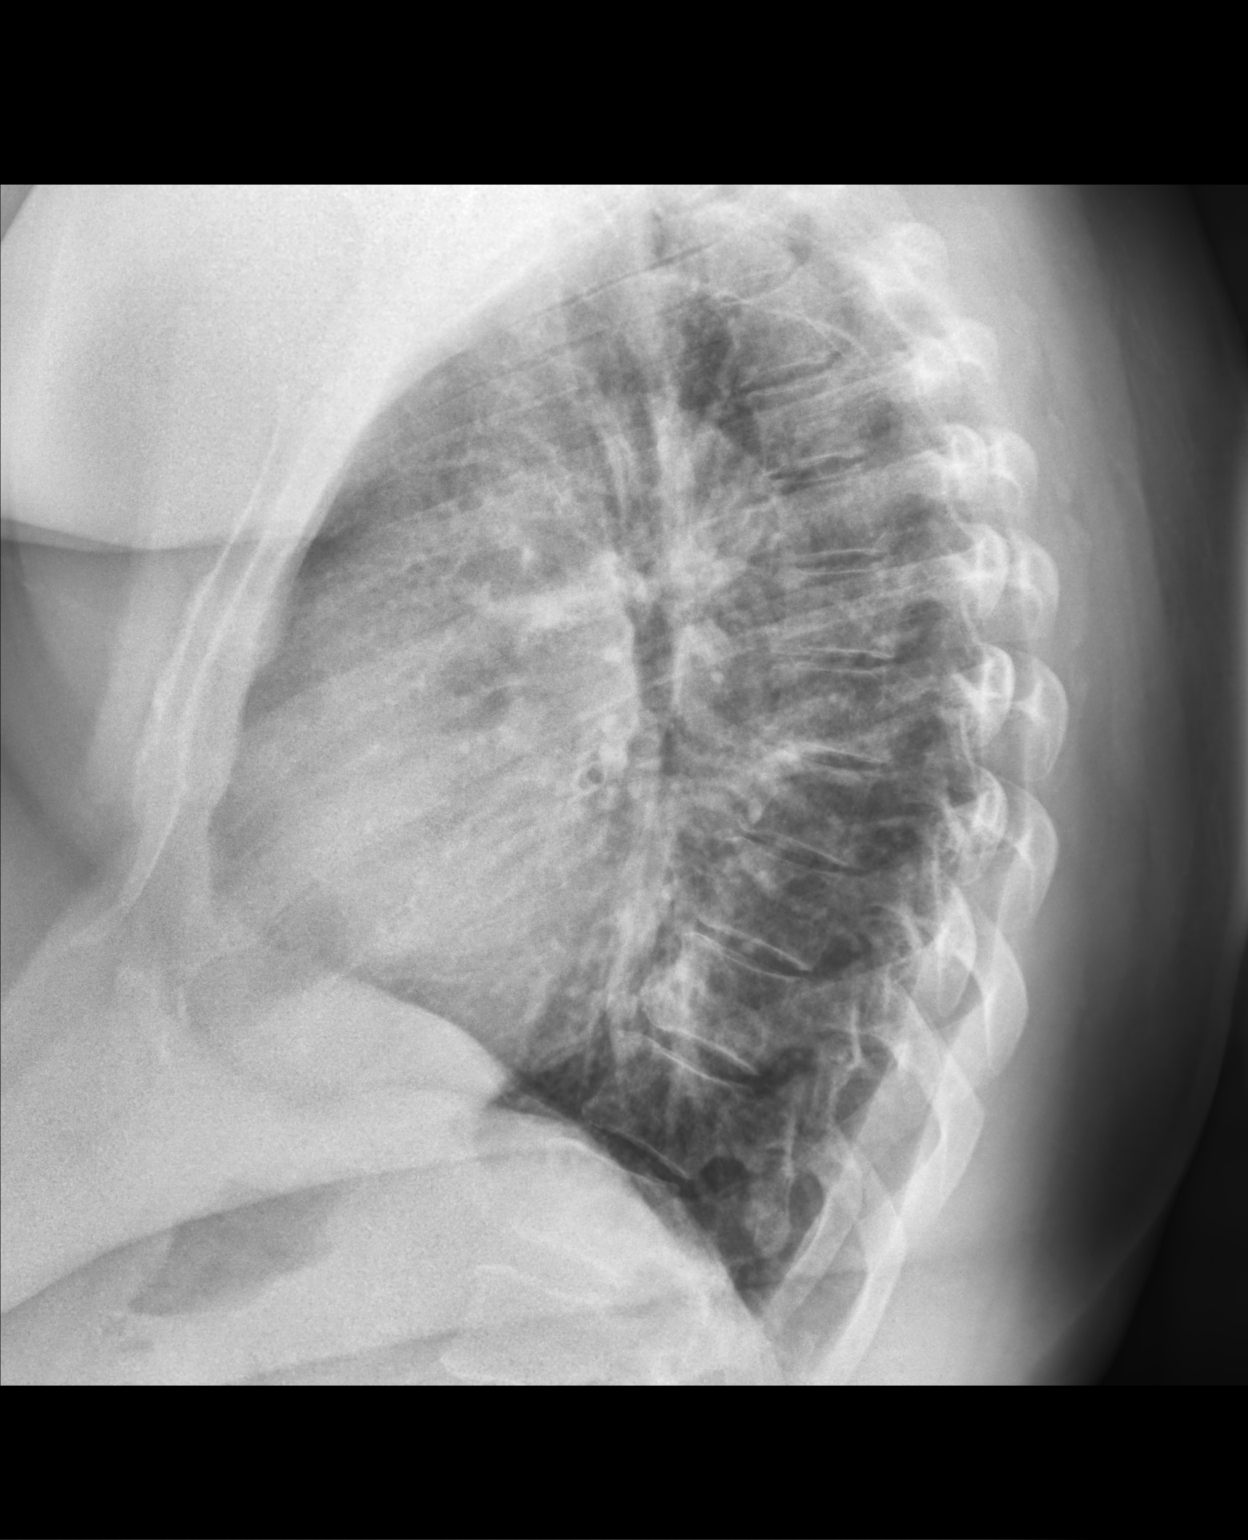

[2 of 2 positions shown; findings below may reference images not displayed]

FINDINGS: Midline trachea. Mild to moderate cardiomegaly. Mediastinal contours
otherwise within normal limits. No pleural effusion or pneumothorax.
No congestive failure. Clear lungs.
IMPRESSION: Cardiomegaly without congestive failure.

## 2022-04-19 NOTE — Telephone Encounter (Signed)
Attempted to reach patient several times to inform her we needed to reschedule her appointment because Sharyn Lull was not in the office. Left a message for her to call to reschedule.

## 2022-04-19 NOTE — Telephone Encounter (Signed)
Contacted pt in regards to appt. Contacted pt to see if she is able to come in earlier. Pt didn't answer lvm

## 2022-05-09 ENCOUNTER — Ambulatory Visit (INDEPENDENT_AMBULATORY_CARE_PROVIDER_SITE_OTHER): Payer: Commercial Managed Care - HMO | Admitting: Primary Care

## 2022-05-23 ENCOUNTER — Ambulatory Visit (INDEPENDENT_AMBULATORY_CARE_PROVIDER_SITE_OTHER): Payer: Commercial Managed Care - HMO | Admitting: Primary Care

## 2022-06-04 ENCOUNTER — Ambulatory Visit (HOSPITAL_COMMUNITY)
Admission: EM | Admit: 2022-06-04 | Discharge: 2022-06-04 | Disposition: A | Discharge status: Home / Self Care | Payer: Commercial Managed Care - HMO | Attending: Emergency Medicine | Admitting: Emergency Medicine

## 2022-06-04 ENCOUNTER — Ambulatory Visit (INDEPENDENT_AMBULATORY_CARE_PROVIDER_SITE_OTHER): Payer: Commercial Managed Care - HMO

## 2022-06-04 ENCOUNTER — Encounter (HOSPITAL_COMMUNITY): Payer: Self-pay | Admitting: Emergency Medicine

## 2022-06-04 DIAGNOSIS — S6990XA Unspecified injury of unspecified wrist, hand and finger(s), initial encounter: Secondary | ICD-10-CM | POA: Diagnosis not present

## 2022-06-04 MED ORDER — ACETAMINOPHEN 325 MG PO TABS
ORAL_TABLET | ORAL | Status: AC
  Filled 2022-06-04: qty 3

## 2022-06-04 MED ORDER — ACETAMINOPHEN 325 MG PO TABS
975.0000 mg | ORAL_TABLET | Freq: Once | ORAL | Status: AC
  Administered 2022-06-04: 975 mg via ORAL

## 2022-06-04 NOTE — Discharge Instructions (Addendum)
Your hand imaging did not show any fractures or breaks.  I suspect you have musculoskeletal and ligament damage due to your swelling and pain.  Please use the RICE method this is rest, ice, compress and elevate.  You can alternate between Tylenol and ibuprofen every 4-6 hours for pain and discomfort.  I have provided you with contact information for hand specialist, please contact them tomorrow to schedule follow-up appointment sometime this week.  Please seek immediate care if you develop worsening numbness, tingling, or sudden increase of pain.

## 2022-06-04 NOTE — ED Provider Notes (Signed)
[facility]    CSN: [id number] Arrival date & time: 06/04/22  1324      History   Chief Complaint Chief Complaint  Patient presents with   Hand Injury    HPI Laurie Buckley is a 57 y.o. female.   Patient was at a social event yesterday when she went to sit down in the chair came out from under her.  She fell backwards and caught herself with her right hand.  Initially after the fall she had right hand and thumb pain.  She put ice on it.  Today she presents to clinic with right hand and thumb swelling, last took ibuprofen earlier today.  Sensation and movement intact.  Denies hitting head, syncope or any other injuries.  The history is provided by the patient and medical records.  Hand Injury Associated symptoms: no fatigue and no fever     Past Medical History:  Diagnosis Date   GERD (gastroesophageal reflux disease)    with certain foods   Hypertension    on meds   Sleep apnea    uses CPAP    There are no problems to display for this patient.   Past Surgical History:  Procedure Laterality Date   TUBAL LIGATION  1996   WISDOM TOOTH EXTRACTION      OB History   No obstetric history on file.      Home Medications    Prior to Admission medications   Medication Sig Start Date End Date Taking? Authorizing Provider  albuterol (VENTOLIN HFA) 108 (90 Base) MCG/ACT inhaler Inhale 1 puff into the lungs every 6 (six) hours as needed for wheezing or shortness of breath. 04/02/22   Rising, Lurena Joiner, PA-C  amLODipine (NORVASC) 10 MG tablet Take 1 tablet by mouth once daily 04/10/22   Grayce Sessions, NP  benzonatate (TESSALON) 100 MG capsule Take 1 capsule (100 mg total) by mouth every 8 (eight) hours as needed for cough. 04/10/22   Gustavus Bryant, FNP  Cholecalciferol (VITAMIN D3) 25 MCG/SPRAY LIQD Take 4 sprays by mouth daily at 6 (six) AM.    [provider]  loratadine (CLARITIN) 10 MG tablet Take 1 tablet (10 mg total) by mouth daily. 10/13/21    Grayce Sessions, NP  losartan-hydrochlorothiazide [facility]) 100-25 MG tablet Take 1 tablet by mouth once daily 04/10/22   Grayce Sessions, NP  UBIQUINOL PO Take 100 mg by mouth daily at 6 (six) AM.    [provider]  LISINOPRIL PO Take by mouth.  03/10/20  [provider]    Family History Family History  Problem Relation Age of Onset   Colon polyps Brother 29   Colon cancer Maternal Uncle    Esophageal cancer Neg Hx    Rectal cancer Neg Hx    Stomach cancer Neg Hx     Social History Social History   Tobacco Use   Smoking status: Never   Smokeless tobacco: Never  Vaping Use   Vaping Use: Never used  Substance Use Topics   Alcohol use: No   Drug use: Never     Allergies   Patient has no known allergies.   Review of Systems Review of Systems  Constitutional:  Negative for chills, fatigue and fever.  Respiratory:  Negative for cough and shortness of breath.   Cardiovascular:  Negative for chest pain.  Gastrointestinal:  Negative for abdominal pain.  Musculoskeletal:  Positive for joint swelling.  Skin:  Negative for wound.  Neurological:  Negative for syncope.     Physical Exam Triage Vital Signs ED Triage Vitals  Enc Vitals Group     BP 06/04/22 1415 (!) 138/90     Pulse Rate 06/04/22 1415 87     Resp 06/04/22 1415 17     Temp 06/04/22 1415 98.3 F (36.8 C)     Temp Source 06/04/22 1415 Oral     SpO2 06/04/22 1415 95 %     Weight --      Height --      Head Circumference --      Peak Flow --      Pain Score 06/04/22 1414 7     Pain Loc --      Pain Edu? --      Excl. in [place]? --    No data found.  Updated Vital Signs BP (!) 138/90 (BP Location: Left Arm)   Pulse 87   Temp 98.3 F (36.8 C) (Oral)   Resp 17   SpO2 95%   Visual Acuity Right Eye Distance:   Left Eye Distance:   Bilateral Distance:    Right Eye Near:   Left Eye Near:    Bilateral Near:     Physical Exam Vitals and nursing note reviewed.   Constitutional:      Appearance: Normal appearance.  HENT:     Head: Normocephalic and atraumatic.     Right Ear: External ear normal.     Left Ear: External ear normal.     Nose: Nose normal.     Mouth/Throat:     Mouth: Mucous membranes are moist.  Eyes:     General: No scleral icterus.    Conjunctiva/sclera: Conjunctivae normal.  Cardiovascular:     Rate and Rhythm: Normal rate and regular rhythm.  Pulmonary:     Effort: Pulmonary effort is normal. No respiratory distress.  Musculoskeletal:        General: Swelling, tenderness, deformity and signs of injury present.     Right hand: Swelling, deformity and tenderness present. Decreased range of motion.     Cervical back: Normal range of motion.     Comments: Significant swelling of the right first metacarpal with associated decreased range of movement.  Patient is neurovascularly intact with brisk capillary refill and sensation.  Skin:    General: Skin is warm and dry.     Capillary Refill: Capillary refill takes less than 2 seconds.  Neurological:     Mental Status: She is alert and oriented to person, place, and time.  Psychiatric:        Mood and Affect: Mood normal.        Behavior: Behavior normal. Behavior is cooperative.      UC Treatments / Results  Labs (all labs ordered are listed, but only abnormal results are displayed) Labs Reviewed - No data to display  EKG   Radiology DG Hand Complete Right  Result Date: 06/04/2022 CLINICAL DATA:  Fall, tried to catch herself with RIGHT hand, complaining of RIGHT wrist and palm pain EXAM: RIGHT HAND - COMPLETE 3+ VIEW COMPARISON:  None Available. FINDINGS: Osseous mineralization normal. Degenerative changes first CMC joint. Joint spaces otherwise preserved. No acute fracture, dislocation, or Sc[place]ena destruction. IMPRESSION: No acute osseous abnormalities. Degenerative changes first CMC joint. Electronically Signed   By: Ulyses Southward M.D.   On: 06/04/2022 15:02     Procedures Procedures (including critical care time)  Medications Ordered in UC Medications  acetaminophen (TYLENOL) tablet 975 mg (has  no administration in time range)    Initial Impression / Assessment and Plan / UC Course  I have reviewed the triage vital signs and the nursing notes.  Pertinent labs & imaging results that were available during my care of the patient were reviewed by me and considered in my medical decision making (see chart for details).  Vitals and triage reviewed, patient is hemodynamically stable.  Right first metacarpal with diffuse swelling, ecchymosis and limitation to range of motion.  Hand imaging negative for fracture or dislocation.  Suspect musculoskeletal/tendon/ligament injury due to amount of swelling.  Advised to follow-up with hand sometime this week.  Discussed RICE.  Return and follow-up precautions reviewed, no questions at this time.     Final Clinical Impressions(s) / UC Diagnoses   Final diagnoses:  Thumb injury, initial encounter     Discharge Instructions      Your hand imaging did not show any fractures or breaks.  I suspect you have musculoskeletal and ligament damage due to your swelling and pain.  Please use the RICE method this is rest, ice, compress and elevate.  You can alternate between Tylenol and ibuprofen every 4-6 hours for pain and discomfort.  I have provided you with contact information for hand specialist, please contact them tomorrow to schedule follow-up appointment sometime this week.  Please seek immediate care if you develop worsening numbness, tingling, or sudden increase of pain.      ED Prescriptions   None    PDMP not reviewed this encounter.   Tiffanni Scarfo, [place], [place] 06/04/22 [phone]

## 2022-06-04 NOTE — ED Triage Notes (Signed)
Pt went to sit down yesterday when chair came out form underneath her causing to fall. Tried to catch self with right hand. Right wrist, palm pain  Took ibuprofen and iced area.

## 2022-06-05 ENCOUNTER — Ambulatory Visit (INDEPENDENT_AMBULATORY_CARE_PROVIDER_SITE_OTHER): Payer: Commercial Managed Care - HMO | Admitting: Primary Care

## 2022-06-26 ENCOUNTER — Ambulatory Visit (INDEPENDENT_AMBULATORY_CARE_PROVIDER_SITE_OTHER): Payer: Commercial Managed Care - HMO | Admitting: Primary Care

## 2022-06-26 ENCOUNTER — Encounter (INDEPENDENT_AMBULATORY_CARE_PROVIDER_SITE_OTHER): Payer: Self-pay | Admitting: Primary Care

## 2022-06-26 VITALS — BP 120/82 | HR 93 | Resp 16 | Wt 275.0 lb

## 2022-06-26 DIAGNOSIS — Z0130 Encounter for examination of blood pressure without abnormal findings: Secondary | ICD-10-CM | POA: Diagnosis not present

## 2022-06-26 NOTE — Progress Notes (Signed)
[facility]   Laurie Buckley is a 57 y.o. female presents for hypertension evaluation, Denies shortness of breath, headaches, chest pain or lower extremity edema, sudden onset, vision changes, unilateral weakness, dizziness, paresthesias   Patient reports adherence with medications.  Dietary habits include: monitoring sodium intake watching calories  Exercise habits include:walking Family / Social history: significant for cancers   Past Medical History:  Diagnosis Date   GERD (gastroesophageal reflux disease)    with certain foods   Hypertension    on meds   Sleep apnea    uses CPAP   Past Surgical History:  Procedure Laterality Date   TUBAL LIGATION  1996   WISDOM TOOTH EXTRACTION     No Known Allergies Current Outpatient Medications on File Prior to Visit  Medication Sig Dispense Refill   albuterol (VENTOLIN HFA) 108 (90 Base) MCG/ACT inhaler Inhale 1 puff into the lungs every 6 (six) hours as needed for wheezing or shortness of breath. 8 g 0   amLODipine (NORVASC) 10 MG tablet Take 1 tablet by mouth once daily 90 tablet 0   benzonatate (TESSALON) 100 MG capsule Take 1 capsule (100 mg total) by mouth every 8 (eight) hours as needed for cough. 21 capsule 0   Cholecalciferol (VITAMIN D3) 25 MCG/SPRAY LIQD Take 4 sprays by mouth daily at 6 (six) AM.     loratadine (CLARITIN) 10 MG tablet Take 1 tablet (10 mg total) by mouth daily. 90 tablet 2   losartan-hydrochlorothiazide (HYZAAR) 100-25 MG tablet Take 1 tablet by mouth once daily 90 tablet 1   UBIQUINOL PO Take 100 mg by mouth daily at 6 (six) AM.     [DISCONTINUED] LISINOPRIL PO Take by mouth.     No current facility-administered medications on file prior to visit.   Social History   Socioeconomic History   Marital status: Single    Spouse name: Not on file   Number of children: Not on file   Years of education: Not on file   Highest education level: Not on file  Occupational History   Not on file   Tobacco Use   Smoking status: Never   Smokeless tobacco: Never  Vaping Use   Vaping Use: Never used  Substance and Sexual Activity   Alcohol use: No   Drug use: Never   Sexual activity: Yes  Other Topics Concern   Not on file  Social History Narrative   Not on file   Social Determinants of Health   Financial Resource Strain: Not on file  Food Insecurity: Not on file  Transportation Needs: Not on file  Physical Activity: Not on file  Stress: Not on file  Social Connections: Not on file  Intimate Partner Violence: Not on file   Family History  Problem Relation Age of Onset   Colon polyps Brother 28   Colon cancer Maternal Uncle    Esophageal cancer Neg Hx    Rectal cancer Neg Hx    Stomach cancer Neg Hx      OBJECTIVE:  Vitals:   06/26/22 1616  BP: 120/82  Pulse: 93  Resp: 16  SpO2: 98%  Weight: 275 lb (124.7 kg)    Physical Exam Vitals reviewed.  Constitutional:      Appearance: She is obese.  HENT:     Head: Normocephalic.     Right Ear: Tympanic membrane and external ear normal.     Left Ear: Tympanic membrane and external ear normal.     Nose: Nose  normal.  Cardiovascular:     Rate and Rhythm: Normal rate and regular rhythm.  Pulmonary:     Effort: Pulmonary effort is normal.     Breath sounds: Normal breath sounds.  Abdominal:     General: Bowel sounds are normal. There is distension.     Palpations: Abdomen is soft.  Musculoskeletal:        General: Normal range of motion.     Cervical back: Normal range of motion and neck supple.  Skin:    General: Skin is warm and dry.  Neurological:     Mental Status: She is oriented to person, place, and time.  Psychiatric:        Mood and Affect: Mood normal.        Behavior: Behavior normal.    ROS  Last 3 Office BP readings: BP Readings from Last 3 Encounters:  06/26/22 120/82  06/04/22 (Abnormal) 138/90  04/10/22 137/86    BMET    Component Value Date/Time   NA 147 (H) 07/13/2021 1109    K 3.6 07/13/2021 1109   CL 108 (H) 07/13/2021 1109   CO2 25 07/13/2021 1109   GLUCOSE 119 (H) 07/13/2021 1109   BUN 17 07/13/2021 1109   CREATININE 1.07 (H) 07/13/2021 1109   CALCIUM 9.3 07/13/2021 1109    Renal function: CrCl cannot be calculated (Patient's most recent lab result is older than the maximum 21 days allowed.).  Clinical ASCVD: Yes  The 10-year ASCVD risk score (Arnett DK, et al., 2019) is: 10.3%   Values used to calculate the score:     Age: 50 years     Sex: Female     Is Non-Hispanic African American: Yes     Diabetic: Yes     Tobacco smoker: No     Systolic Blood Pressure: 120 mmHg     Is BP treated: Yes     HDL Cholesterol: 41 mg/dL     Total Cholesterol: 142 mg/dL  ASCVD risk factors include- [place]   ASSESSMENT & PLAN: Laurie Buckley was seen today for hypertension and hospitalization follow-up.  Diagnoses and all orders for this visit:  Blood pressure check -Counseled on lifestyle modifications for blood pressure control including reduced dietary sodium, increased exercise, weight reduction and adequate sleep. Also, educated patient about the risk for cardiovascular events, stroke and heart attack. Also counseled patient about the importance of medication adherence. If you participate in smoking, it is important to stop using tobacco as this will increase the risks associated with uncontrolled blood pressure.   . Patient is adherent with current medications.   Goal BP:  For patients younger than 60: Goal BP < 130/80. For patients 60 and older: Goal BP < 140/90. For patients with diabetes: Goal BP < 130/80. Your most recent BP: 120/82  Minimize salt intake. Minimize alcohol intake    This note has been created with Education officer, environmental. Any transcriptional errors are unintentional.   Grayce Sessions, NP 06/26/2022, 4:53 PM

## 2022-09-27 ENCOUNTER — Ambulatory Visit (INDEPENDENT_AMBULATORY_CARE_PROVIDER_SITE_OTHER): Payer: Commercial Managed Care - HMO | Admitting: Primary Care

## 2022-10-08 ENCOUNTER — Other Ambulatory Visit (INDEPENDENT_AMBULATORY_CARE_PROVIDER_SITE_OTHER): Payer: Self-pay | Admitting: Primary Care

## 2022-10-08 DIAGNOSIS — I10 Essential (primary) hypertension: Secondary | ICD-10-CM

## 2022-10-10 NOTE — Telephone Encounter (Signed)
Requested medication (s) are due for refill today: yes   Requested medication (s) are on the active medication list: yes   Last refill:  04/10/22 #90 1 refill  Future visit scheduled: no   Notes to clinic:  protocol failed last labs 07/13/21. Do you want to refill Rx?     Requested Prescriptions  Pending Prescriptions Disp Refills   losartan-hydrochlorothiazide (HYZAAR) 100-25 MG tablet [Pharmacy Med Name: Losartan Potassium-HCTZ 100-25 MG Oral Tablet] 90 tablet 0    Sig: Take 1 tablet by mouth once daily     Cardiovascular: ARB + Diuretic Combos Failed - 10/08/2022  7:50 AM      Failed - K in normal range and within 180 days    Potassium  Date Value Ref Range Status  07/13/2021 3.6 3.5 - 5.2 mmol/L Final         Failed - Na in normal range and within 180 days    Sodium  Date Value Ref Range Status  07/13/2021 147 (H) 134 - 144 mmol/L Final         Failed - Cr in normal range and within 180 days    Creatinine, Ser  Date Value Ref Range Status  07/13/2021 1.07 (H) 0.57 - 1.00 mg/dL Final         Failed - eGFR is 10 or above and within 180 days    eGFR  Date Value Ref Range Status  07/13/2021 61 >59 mL/min/1.73 Final         Passed - Patient is not pregnant      Passed - Last BP in normal range    BP Readings from Last 1 Encounters:  06/26/22 120/82         Passed - Valid encounter within last 6 months    Recent Outpatient Visits           3 months ago Blood pressure check   [place] [facility] Grayce Sessions, NP   8 months ago type 2 diabetes   [place] [facility] Grayce Sessions, NP   12 months ago Essential hypertension   [place] [facility] Grayce Sessions, NP   1 year ago Prediabetes   [place] [facility] Grayce Sessions, NP   1 year ago Cough, unspecified type    [facility] Grayce Sessions, NP

## 2022-12-19 ENCOUNTER — Ambulatory Visit (INDEPENDENT_AMBULATORY_CARE_PROVIDER_SITE_OTHER): Payer: Commercial Managed Care - HMO | Admitting: Primary Care

## 2022-12-19 ENCOUNTER — Telehealth (INDEPENDENT_AMBULATORY_CARE_PROVIDER_SITE_OTHER): Payer: Self-pay | Admitting: Primary Care

## 2022-12-19 DIAGNOSIS — Z1231 Encounter for screening mammogram for malignant neoplasm of breast: Secondary | ICD-10-CM

## 2022-12-19 DIAGNOSIS — Z1159 Encounter for screening for other viral diseases: Secondary | ICD-10-CM | POA: Diagnosis not present

## 2022-12-19 DIAGNOSIS — R059 Cough, unspecified: Secondary | ICD-10-CM

## 2022-12-19 DIAGNOSIS — R7303 Prediabetes: Secondary | ICD-10-CM | POA: Diagnosis not present

## 2022-12-19 DIAGNOSIS — Z114 Encounter for screening for human immunodeficiency virus [HIV]: Secondary | ICD-10-CM

## 2022-12-19 DIAGNOSIS — Z760 Encounter for issue of repeat prescription: Secondary | ICD-10-CM

## 2022-12-19 DIAGNOSIS — I10 Essential (primary) hypertension: Secondary | ICD-10-CM | POA: Diagnosis not present

## 2022-12-19 MED ORDER — LOSARTAN POTASSIUM-HCTZ 100-25 MG PO TABS: 1.0000 | ORAL_TABLET | Freq: Every day | ORAL | 0 refills | Status: DC

## 2022-12-19 MED ORDER — AMLODIPINE BESYLATE 10 MG PO TABS: 10.0000 mg | ORAL_TABLET | Freq: Every day | ORAL | 0 refills | Status: DC

## 2022-12-19 MED ORDER — ALBUTEROL SULFATE HFA 108 (90 BASE) MCG/ACT IN AERS: 1.0000 | INHALATION_SPRAY | Freq: Four times a day (QID) | RESPIRATORY_TRACT | 0 refills | Status: DC | PRN

## 2022-12-19 MED ORDER — LORATADINE 10 MG PO TABS
10.0000 mg | ORAL_TABLET | Freq: Every day | ORAL | 2 refills | Status: DC
Start: 2022-12-19 — End: 2023-10-23

## 2022-12-19 NOTE — Patient Instructions (Signed)
Calorie Counting for Weight Loss Calories are units of energy. Your body needs a certain number of calories from food to keep going throughout the day. When you eat or drink more calories than your body needs, your body stores the extra calories mostly as fat. When you eat or drink fewer calories than your body needs, your body burns fat to get the energy it needs. Calorie counting means keeping track of how many calories you eat and drink each day. Calorie counting can be helpful if you need to lose weight. If you eat fewer calories than your body needs, you should lose weight. Ask your health care provider what a healthy weight is for you. For calorie counting to work, you will need to eat the right number of calories each day to lose a healthy amount of weight per week. A dietitian can help you figure out how many calories you need in a day and will suggest ways to reach your calorie goal. A healthy amount of weight to lose each week is usually 1-2 lb (0.5-0.9 kg). This usually means that your daily calorie intake should be reduced by 500-750 calories. Eating 1,200-1,500 calories a day can help most women lose weight. Eating 1,500-1,800 calories a day can help most men lose weight. What do I need to know about calorie counting? Work with your health care provider or dietitian to determine how many calories you should get each day. To meet your daily calorie goal, you will need to: Find out how many calories are in each food that you would like to eat. Try to do this before you eat. Decide how much of the food you plan to eat. Keep a food log. Do this by writing down what you ate and how many calories it had. To successfully lose weight, it is important to balance calorie counting with a healthy lifestyle that includes regular activity. Where do I find calorie information?  The number of calories in a food can be found on a Nutrition Facts label. If a food does not have a Nutrition Facts label, try  to look up the calories online or ask your dietitian for help. Remember that calories are listed per serving. If you choose to have more than one serving of a food, you will have to multiply the calories per serving by the number of servings you plan to eat. For example, the label on a package of bread might say that a serving size is 1 slice and that there are 90 calories in a serving. If you eat 1 slice, you will have eaten 90 calories. If you eat 2 slices, you will have eaten 180 calories. How do I keep a food log? After each time that you eat, record the following in your food log as soon as possible: What you ate. Be sure to include toppings, sauces, and other extras on the food. How much you ate. This can be measured in cups, ounces, or number of items. How many calories were in each food and drink. The total number of calories in the food you ate. Keep your food log near you, such as in a pocket-sized notebook or on an app or website on your mobile phone. Some programs will calculate calories for you and show you how many calories you have left to meet your daily goal. What are some portion-control tips? Know how many calories are in a serving. This will help you know how many servings you can have of a certain   food. Use a measuring cup to measure serving sizes. You could also try weighing out portions on a kitchen scale. With time, you will be able to estimate serving sizes for some foods. Take time to put servings of different foods on your favorite plates or in your favorite bowls and cups so you know what a serving looks like. Try not to eat straight from a food's packaging, such as from a bag or box. Eating straight from the package makes it hard to see how much you are eating and can lead to overeating. Put the amount you would like to eat in a cup or on a plate to make sure you are eating the right portion. Use smaller plates, glasses, and bowls for smaller portions and to prevent  overeating. Try not to multitask. For example, avoid watching TV or using your computer while eating. If it is time to eat, sit down at a table and enjoy your food. This will help you recognize when you are full. It will also help you be more mindful of what and how much you are eating. What are tips for following this plan? Reading food labels Check the calorie count compared with the serving size. The serving size may be smaller than what you are used to eating. Check the source of the calories. Try to choose foods that are high in protein, fiber, and vitamins, and low in saturated fat, trans fat, and sodium. Shopping Read nutrition labels while you shop. This will help you make healthy decisions about which foods to buy. Pay attention to nutrition labels for low-fat or fat-free foods. These foods sometimes have the same number of calories or more calories than the full-fat versions. They also often have added sugar, starch, or salt to make up for flavor that was removed with the fat. Make a grocery list of lower-calorie foods and stick to it. Cooking Try to cook your favorite foods in a healthier way. For example, try baking instead of frying. Use low-fat dairy products. Meal planning Use more fruits and vegetables. One-half of your plate should be fruits and vegetables. Include lean proteins, such as chicken, turkey, and fish. Lifestyle Each week, aim to do one of the following: 150 minutes of moderate exercise, such as walking. 75 minutes of vigorous exercise, such as running. General information Know how many calories are in the foods you eat most often. This will help you calculate calorie counts faster. Find a way of tracking calories that works for you. Get creative. Try different apps or programs if writing down calories does not work for you. What foods should I eat?  Eat nutritious foods. It is better to have a nutritious, high-calorie food, such as an avocado, than a food with  few nutrients, such as a bag of potato chips. Use your calories on foods and drinks that will fill you up and will not leave you hungry soon after eating. Examples of foods that fill you up are nuts and nut butters, vegetables, lean proteins, and high-fiber foods such as whole grains. High-fiber foods are foods with more than 5 g of fiber per serving. Pay attention to calories in drinks. Low-calorie drinks include water and unsweetened drinks. The items listed above may not be a complete list of foods and beverages you can eat. Contact a dietitian for more information. What foods should I limit? Limit foods or drinks that are not good sources of vitamins, minerals, or protein or that are high in unhealthy fats. These   include: Candy. Other sweets. Sodas, specialty coffee drinks, alcohol, and juice. The items listed above may not be a complete list of foods and beverages you should avoid. Contact a dietitian for more information. How do I count calories when eating out? Pay attention to portions. Often, portions are much larger when eating out. Try these tips to keep portions smaller: Consider sharing a meal instead of getting your own. If you get your own meal, eat only half of it. Before you start eating, ask for a container and put half of your meal into it. When available, consider ordering smaller portions from the menu instead of full portions. Pay attention to your food and drink choices. Knowing the way food is cooked and what is included with the meal can help you eat fewer calories. If calories are listed on the menu, choose the lower-calorie options. Choose dishes that include vegetables, fruits, whole grains, low-fat dairy products, and lean proteins. Choose items that are boiled, broiled, grilled, or steamed. Avoid items that are buttered, battered, fried, or served with cream sauce. Items labeled as crispy are usually fried, unless stated otherwise. Choose water, low-fat milk,  unsweetened iced tea, or other drinks without added sugar. If you want an alcoholic beverage, choose a lower-calorie option, such as a glass of wine or light beer. Ask for dressings, sauces, and syrups on the side. These are usually high in calories, so you should limit the amount you eat. If you want a salad, choose a garden salad and ask for grilled meats. Avoid extra toppings such as bacon, cheese, or fried items. Ask for the dressing on the side, or ask for olive oil and vinegar or lemon to use as dressing. Estimate how many servings of a food you are given. Knowing serving sizes will help you be aware of how much food you are eating at restaurants. Where to find more information Centers for Disease Control and Prevention: www.cdc.gov U.[place]. Department of Agriculture: myplate.gov Summary Calorie counting means keeping track of how many calories you eat and drink each day. If you eat fewer calories than your body needs, you should lose weight. A healthy amount of weight to lose per week is usually 1-2 lb (0.5-0.9 kg). This usually means reducing your daily calorie intake by 500-750 calories. The number of calories in a food can be found on a Nutrition Facts label. If a food does not have a Nutrition Facts label, try to look up the calories online or ask your dietitian for help. Use smaller plates, glasses, and bowls for smaller portions and to prevent overeating. Use your calories on foods and drinks that will fill you up and not leave you hungry shortly after a meal. This information is not intended to replace advice given to you by your health care provider. Make sure you discuss any questions you have with your health care provider. Document Revised: 03/06/2019 Document Reviewed: 03/06/2019 Elsevier Patient Education  2023 Elsevier Inc.  

## 2022-12-19 NOTE — Telephone Encounter (Signed)
Per nurse, the pt would have to be seen to have paperwork completed. I called to schedule apt. No answer VM left.

## 2022-12-20 LAB — CBC WITH DIFFERENTIAL/PLATELET
Basophils Absolute: 0 10*3/uL (ref 0.0–0.2)
Basos: 0 %
EOS (ABSOLUTE): 0.1 10*3/uL (ref 0.0–0.4)
Eos: 2 %
Hematocrit: 45.9 % (ref 34.0–46.6)
Hemoglobin: 15.1 g/dL (ref 11.1–15.9)
Immature Grans (Abs): 0 10*3/uL (ref 0.0–0.1)
Immature Granulocytes: 0 %
Lymphocytes Absolute: 3.4 10*3/uL — ABNORMAL HIGH (ref 0.7–3.1)
Lymphs: 49 %
MCH: 29.5 pg (ref 26.6–33.0)
MCHC: 32.9 g/dL (ref 31.5–35.7)
MCV: 90 fL (ref 79–97)
Monocytes Absolute: 0.5 10*3/uL (ref 0.1–0.9)
Monocytes: 7 %
Neutrophils Absolute: 3 10*3/uL (ref 1.4–7.0)
Neutrophils: 42 %
Platelets: 267 10*3/uL (ref 150–450)
RBC: 5.12 x10E6/uL (ref 3.77–5.28)
RDW: 12.8 % (ref 11.7–15.4)
WBC: 7 10*3/uL (ref 3.4–10.8)

## 2022-12-20 LAB — CMP14+EGFR
ALT: 12 [IU]/L (ref 0–32)
AST: 20 [IU]/L (ref 0–40)
Albumin: 4.2 g/dL (ref 3.8–4.9)
Alkaline Phosphatase: 84 [IU]/L (ref 44–121)
BUN/Creatinine Ratio: 19 (ref 9–23)
BUN: 18 mg/dL (ref 6–24)
Bilirubin Total: 0.3 mg/dL (ref 0.0–1.2)
CO2: 23 mmol/L (ref 20–29)
Calcium: 9.4 mg/dL (ref 8.7–10.2)
Chloride: 106 mmol/L (ref 96–106)
Creatinine, Ser: 0.95 mg/dL (ref 0.57–1.00)
Globulin, Total: 3.6 g/dL (ref 1.5–4.5)
Glucose: 131 mg/dL — ABNORMAL HIGH (ref 70–99)
Potassium: 3.7 mmol/L (ref 3.5–5.2)
Sodium: 144 mmol/L (ref 134–144)
Total Protein: 7.8 g/dL (ref 6.0–8.5)
eGFR: 69 mL/min/{1.73_m2} (ref >59)

## 2022-12-20 LAB — LIPID PANEL
Chol/HDL Ratio: 3.7 ratio (ref 0.0–4.4)
Cholesterol, Total: 161 mg/dL (ref 100–199)
HDL: 43 mg/dL (ref >39)
LDL Chol Calc (NIH): 92 mg/dL (ref 0–99)
Triglycerides: 151 mg/dL — ABNORMAL HIGH (ref 0–149)
VLDL Cholesterol Cal: 26 mg/dL (ref 5–40)

## 2022-12-20 LAB — HEMOGLOBIN A1C
Est. average glucose Bld gHb Est-mCnc: 169 mg/dL
Hgb A1c MFr Bld: 7.5 % — ABNORMAL HIGH (ref 4.8–5.6)

## 2022-12-20 LAB — HCV AB W REFLEX TO QUANT PCR: HCV Ab: NONREACTIVE

## 2022-12-20 LAB — HIV ANTIBODY (ROUTINE TESTING W REFLEX): HIV Screen 4th Generation wRfx: NONREACTIVE

## 2022-12-20 LAB — HCV INTERPRETATION

## 2022-12-21 ENCOUNTER — Ambulatory Visit (INDEPENDENT_AMBULATORY_CARE_PROVIDER_SITE_OTHER): Payer: Commercial Managed Care - HMO | Admitting: Primary Care

## 2022-12-24 NOTE — Progress Notes (Signed)
[facility]  Laurie Buckley, is a 57 y.o. female  [id number]  [id number]  DOB - 12/21/1965 HTN/T2D      Subjective:   Laurie Buckley is a 57 y.o. female here today for a follow up visit.  Management of hypertension patient has No headache, No chest pain, No abdominal pain - No Nausea, No new weakness tingling or numbness, No Cough - shortness of breath.  Type 2 diabetes-Denies polyuria, polydipsia, polyphasia or vision changes.  Does not check blood sugars at home.   No problems updated.  No Known Allergies  Past Medical History:  Diagnosis Date   GERD (gastroesophageal reflux disease)    with certain foods   Hypertension    on meds   Sleep apnea    uses CPAP    Current Outpatient Medications on File Prior to Visit  Medication Sig Dispense Refill   benzonatate (TESSALON) 100 MG capsule Take 1 capsule (100 mg total) by mouth every 8 (eight) hours as needed for cough. 21 capsule 0   Cholecalciferol (VITAMIN D3) 25 MCG/SPRAY LIQD Take 4 sprays by mouth daily at 6 (six) AM.     UBIQUINOL PO Take 100 mg by mouth daily at 6 (six) AM.     [DISCONTINUED] LISINOPRIL PO Take by mouth.     No current facility-administered medications on file prior to visit.    Objective:  There were no vitals filed for this visit.  Comprehensive ROS Pertinent positive and negative noted in HPI   Exam General appearance : Awake, alert, not in any distress. Speech Clear. Not toxic looking HEENT: Atraumatic and Normocephalic, pupils equally reactive to light and accomodation Neck: Supple, no JVD. No cervical lymphadenopathy.  Chest: Good air entry bilaterally, no added sounds  CVS: S1 S2 regular, no murmurs.  Abdomen: Bowel sounds present, Non tender and not distended with no gaurding, rigidity or rebound. Extremities: B/L Lower Ext shows no edema, both legs are warm to touch Neurology: Awake alert, and oriented X 3, CN II-XII intact, Non focal Skin: No Rash  Data  Review Lab Results  Component Value Date   HGBA1C 7.5 (H) 12/19/2022   HGBA1C 6.4 01/18/2022   HGBA1C 6.6 (A) 07/13/2021    Assessment & Plan   Diagnoses and all orders for this visit:  type 2 diabetes -     CBC with Differential/Platelet -     Hemoglobin A1c has increased from 6.4-7.5 currently on no medication will consult with clinical pharmacist to start on Ozempic 2/2 diabetes and obesity -     Lipid panel  Encounter for screening mammogram for malignant neoplasm of breast -     Cancel: MM DIGITAL SCREENING BILATERAL; Future -     MM 3D SCREENING MAMMOGRAM BILATERAL BREAST; Future  Encounter for HCV screening test for low risk patient -     HCV Ab w Reflex to Quant PCR  Encounter for screening for HIV -     HIV Antibody (routine testing w rflx)  Essential hypertension -     CMP14+EGFR -     amLODipine (NORVASC) 10 MG tablet; Take 1 tablet (10 mg total) by mouth daily. -     losartan-hydrochlorothiazide (HYZAAR) 100-25 MG tablet; Take 1 tablet by mouth daily.  Cough, unspecified type -     albuterol (VENTOLIN HFA) 108 (90 Base) MCG/ACT inhaler; Inhale 1 puff into the lungs every 6 (six) hours as needed for wheezing or shortness of breath. -     loratadine (CLARITIN) 10 MG  tablet; Take 1 tablet (10 mg total) by mouth daily.  Medication refill -     albuterol (VENTOLIN HFA) 108 (90 Base) MCG/ACT inhaler; Inhale 1 puff into the lungs every 6 (six) hours as needed for wheezing or shortness of breath. -     amLODipine (NORVASC) 10 MG tablet; Take 1 tablet (10 mg total) by mouth daily. -     loratadine (CLARITIN) 10 MG tablet; Take 1 tablet (10 mg total) by mouth daily. -     losartan-hydrochlorothiazide (HYZAAR) 100-25 MG tablet; Take 1 tablet by mouth daily.  Other orders -     Interpretation:     Patient have been counseled extensively about nutrition and exercise. Other issues discussed during this visit include: low cholesterol diet, weight control and daily  exercise, foot care, annual eye examinations at Ophthalmology, importance of adherence with medications and regular follow-up. We also discussed long term complications of uncontrolled diabetes and hypertension.   Return in about 3 months (around 03/21/2023) for medical conditions.  The patient was given clear instructions to go to ER or return to medical center if symptoms don't improve, worsen or new problems develop. The patient verbalized understanding. The patient was told to call to get lab results if they haven't heard anything in the next week.   This note has been created with Education officer, environmental. Any transcriptional errors are unintentional.   Grayce Sessions, NP 12/24/2022, 7:23 PM

## 2022-12-29 ENCOUNTER — Other Ambulatory Visit (INDEPENDENT_AMBULATORY_CARE_PROVIDER_SITE_OTHER): Payer: Self-pay | Admitting: Primary Care

## 2022-12-29 MED ORDER — METFORMIN HCL 1000 MG PO TABS
1000.0000 mg | ORAL_TABLET | Freq: Two times a day (BID) | ORAL | 1 refills | Status: DC
Start: 2022-12-29 — End: 2023-04-02

## 2023-01-03 ENCOUNTER — Encounter (INDEPENDENT_AMBULATORY_CARE_PROVIDER_SITE_OTHER): Payer: Self-pay

## 2023-01-10 ENCOUNTER — Ambulatory Visit (INDEPENDENT_AMBULATORY_CARE_PROVIDER_SITE_OTHER): Payer: Commercial Managed Care - HMO

## 2023-01-10 VITALS — BP 122/80

## 2023-01-10 DIAGNOSIS — Z0130 Encounter for examination of blood pressure without abnormal findings: Secondary | ICD-10-CM

## 2023-01-10 NOTE — Progress Notes (Signed)
   Blood Pressure Recheck Visit  Name: Laurie Buckley MRN: [id number] Date of Birth: 04-Feb-1966  Laurie Buckley presents today for Blood Pressure recheck with clinical support staff.    BP Readings from Last 3 Encounters:  01/10/23 122/80  06/26/22 120/82  06/04/22 (!) 138/90    Current Outpatient Medications  Medication Sig Dispense Refill   albuterol (VENTOLIN HFA) 108 (90 Base) MCG/ACT inhaler Inhale 1 puff into the lungs every 6 (six) hours as needed for wheezing or shortness of breath. 8 g 0   amLODipine (NORVASC) 10 MG tablet Take 1 tablet (10 mg total) by mouth daily. 90 tablet 0   benzonatate (TESSALON) 100 MG capsule Take 1 capsule (100 mg total) by mouth every 8 (eight) hours as needed for cough. 21 capsule 0   Cholecalciferol (VITAMIN D3) 25 MCG/SPRAY LIQD Take 4 sprays by mouth daily at 6 (six) AM.     loratadine (CLARITIN) 10 MG tablet Take 1 tablet (10 mg total) by mouth daily. 90 tablet 2   losartan-hydrochlorothiazide (HYZAAR) 100-25 MG tablet Take 1 tablet by mouth daily. 90 tablet 0   metFORMIN (GLUCOPHAGE) 1000 MG tablet Take 1 tablet (1,000 mg total) by mouth 2 (two) times daily with a meal. 180 tablet 1   UBIQUINOL PO Take 100 mg by mouth daily at 6 (six) AM.     No current facility-administered medications for this visit.    Hypertensive Medication Review: Patient states that they are taking all their hypertensive medications as prescribed and their last dose of hypertensive medications was this morning   Documentation of any medication adherence discrepancies: none  Also pt complained of having a cough with some yellow phlegm. Pt states did take a covid test and it was negative. Pt states the phlegm sometimes doesn't come up but most of the time it does   Provider Recommendation:  Spoke to [place] and they stated: bp is great continue medication and as far as cough will send in Guaifenesin  Patient already has 3 month f/u scheduled.   Patient has been  given provider's recommendations and does not have any questions or concerns at this time. Patient will contact the office for any future questions or concerns.

## 2023-03-05 ENCOUNTER — Ambulatory Visit (INDEPENDENT_AMBULATORY_CARE_PROVIDER_SITE_OTHER): Payer: Self-pay | Admitting: Primary Care

## 2023-03-05 NOTE — Telephone Encounter (Signed)
  Chief Complaint: R foot swelling Symptoms: R foot swelling, mild pain Frequency: 2 weeks Pertinent Negatives: Patient denies  chest pain, SOB, fever, injury Disposition: [] ED /[] Urgent Care (no appt availability in office) / [x] Appointment(In office/virtual)/ []  [facility]/ [] Home Care/ [] Refused Recommended Disposition /[]  Mobile Bus/ []  Follow-up with PCP Additional Notes:  Patient c/o R foot swelling x 2 weeks. Denies injury, fever, redness, chest pain, SOB. Reports she has been elevating with no improvement. Reports tightness when flexing, putting on shoes. Scheduled patient per protocol on 03/08/2023. Patient verbalized understanding and to call back with worsening symptoms.    Copied from CRM [id number]. Topic: Clinical - Red Word Triage >> Mar 05, 2023 10:54 AM Gildardo Pounds wrote: Red Word that prompted transfer to Nurse Triage: swelling in right foot, inflammation, cannot wear shoes, no injury; tightness when flexing; going on for 2 weeks. Callback number is [phone] Reason for Disposition  [1] MILD swelling of both ankles (i.e., pedal edema) AND [2] new-onset or worsening  Answer Assessment - Initial Assessment Questions 1. ONSET: "When did the swelling start?" (e.g., minutes, hours, days)     2 weeks 2. LOCATION: "What part of the leg is swollen?"  "Are both legs swollen or just one leg?"     R foot only 3. SEVERITY: "How bad is the swelling?" (e.g., localized; mild, moderate, severe)   - Localized: Small area of swelling localized to one leg.   - MILD pedal edema: Swelling limited to foot and ankle, pitting edema < 1/4 inch (6 mm) deep, rest and elevation eliminate most or all swelling.   - MODERATE edema: Swelling of lower leg to knee, pitting edema > 1/4 inch (6 mm) deep, rest and elevation only partially reduce swelling.   - SEVERE edema: Swelling extends above knee, facial or hand swelling present.  Mild - swelling up to ankle     Tightness across the  top when flexing foot Barely can wear a shoe Denies injuries 4. REDNESS: "Does the swelling look red or infected?"     no 5. PAIN: "Is the swelling painful to touch?" If Yes, ask: "How painful is it?"   (Scale 1-10; mild, moderate or severe)     Painful when flexing, putting on shoe 6. FEVER: "Do you have a fever?" If Yes, ask: "What is it, how was it measured, and when did it start?"      no 7. CAUSE: "What do you think is causing the leg swelling?"     I don't know, denies injuries 8. MEDICAL HISTORY: "Do you have a history of blood clots (e.g., DVT), cancer, heart failure, kidney disease, or liver failure?"     no 9. RECURRENT SYMPTOM: "Have you had leg swelling before?" If Yes, ask: "When was the last time?" "What happened that time?"     Reports she thinks she had arthritis and and similar issues in L foot 10. OTHER SYMPTOMS: "Do you have any other symptoms?" (e.g., chest pain, difficulty breathing)       no  Protocols used: Leg Swelling and Edema-A-AH

## 2023-03-08 ENCOUNTER — Encounter (INDEPENDENT_AMBULATORY_CARE_PROVIDER_SITE_OTHER): Payer: Self-pay | Admitting: Primary Care

## 2023-03-08 ENCOUNTER — Ambulatory Visit (INDEPENDENT_AMBULATORY_CARE_PROVIDER_SITE_OTHER): Payer: 59 | Admitting: Primary Care

## 2023-03-08 VITALS — BP 148/87 | HR 86 | Resp 16 | Ht 64.0 in | Wt 283.2 lb

## 2023-03-08 DIAGNOSIS — L03319 Cellulitis of trunk, unspecified: Secondary | ICD-10-CM | POA: Diagnosis not present

## 2023-03-08 DIAGNOSIS — R600 Localized edema: Secondary | ICD-10-CM | POA: Diagnosis not present

## 2023-03-08 MED ORDER — FUROSEMIDE 20 MG PO TABS: 20.0000 mg | ORAL_TABLET | Freq: Every day | ORAL | 0 refills | Status: DC

## 2023-03-08 MED ORDER — FUROSEMIDE 20 MG PO TABS: 20.0000 mg | ORAL_TABLET | Freq: Every day | ORAL | 3 refills | Status: DC

## 2023-03-08 NOTE — Progress Notes (Signed)
[facility]  Laurie Buckley, is a 58 y.o. female  [id number]  [id number]  DOB - 1965/05/29  Chief Complaint  Patient presents with   Foot Swelling    B/l More swelling in the right foot        Subjective:   Laurie Buckley is a 58 y.o. female here today for an acute visit.  HPI  No problems updated.  Comprehensive ROS Pertinent positive and negative noted in HPI   No Known Allergies  Past Medical History:  Diagnosis Date   GERD (gastroesophageal reflux disease)    with certain foods   Hypertension    on meds   Sleep apnea    uses CPAP    Current Outpatient Medications on File Prior to Visit  Medication Sig Dispense Refill   albuterol (VENTOLIN HFA) 108 (90 Base) MCG/ACT inhaler Inhale 1 puff into the lungs every 6 (six) hours as needed for wheezing or shortness of breath. 8 g 0   amLODipine (NORVASC) 10 MG tablet Take 1 tablet (10 mg total) by mouth daily. 90 tablet 0   benzonatate (TESSALON) 100 MG capsule Take 1 capsule (100 mg total) by mouth every 8 (eight) hours as needed for cough. 21 capsule 0   loratadine (CLARITIN) 10 MG tablet Take 1 tablet (10 mg total) by mouth daily. 90 tablet 2   losartan-hydrochlorothiazide (HYZAAR) 100-25 MG tablet Take 1 tablet by mouth daily. 90 tablet 0   metFORMIN (GLUCOPHAGE) 1000 MG tablet Take 1 tablet (1,000 mg total) by mouth 2 (two) times daily with a meal. 180 tablet 1   UBIQUINOL PO Take 100 mg by mouth daily at 6 (six) AM.     [DISCONTINUED] LISINOPRIL PO Take by mouth.     No current facility-administered medications on file prior to visit.   Health Maintenance  Topic Date Due   Zoster (Shingles) Vaccine (1 of 2) Never done   Mammogram  03/31/2016   COVID-19 Vaccine (1 - 2024-25 season) Never done   Flu Shot  05/07/2023*   Pap with HPV screening  06/03/2023   DTaP/Tdap/Td vaccine (2 - Td or Tdap) 05/15/2030   Colon Cancer Screening  10/23/2030   Hepatitis C Screening  Completed   HIV  Screening  Completed   HPV Vaccine  Aged Out  *Topic was postponed. The date shown is not the original due date.    Objective:   Vitals:   03/08/23 1627 03/08/23 1628  BP: (!) 147/87 (!) 148/87  Pulse: 86   Resp: 16   SpO2: 96%   Weight: 283 lb 3.2 oz (128.5 kg)   Height: 5\' 4"  (1.626 m)    Physical Exam Vitals reviewed.  Constitutional:      Appearance: She is obese.  HENT:     Right Ear: Tympanic membrane and external ear normal.     Left Ear: Tympanic membrane and external ear normal.  Eyes:     Extraocular Movements: Extraocular movements intact.  Cardiovascular:     Rate and Rhythm: Normal rate and regular rhythm.  Pulmonary:     Effort: Pulmonary effort is normal.     Breath sounds: Normal breath sounds.  Abdominal:     General: Bowel sounds are normal. There is distension.     Palpations: Abdomen is soft.  Musculoskeletal:        General: Swelling present. Normal range of motion.     Right lower leg: Edema present.     Left lower leg: Edema present.  Skin:  Findings: Erythema present.  Neurological:     Mental Status: She is oriented to person, place, and time.  Psychiatric:        Mood and Affect: Mood normal.        Behavior: Behavior normal.        Thought Content: Thought content normal.        Judgment: Judgment normal.     Assessment & Plan  Wilba was seen today for foot swelling.  Diagnoses and all orders for this visit:  Cellulitis of trunk, unspecified site of trunk  Localized edema -     Discontinue: furosemide (LASIX) 20 MG tablet; Take 1 tablet (20 mg total) by mouth daily. -     furosemide (LASIX) 20 MG tablet; Take 1 tablet (20 mg total) by mouth daily.   Patient have been counseled extensively about nutrition and exercise. Other issues discussed during this visit include: low cholesterol diet, weight control and daily exercise, foot care, annual eye examinations at Ophthalmology, importance of adherence with medications and regular  follow-up. We also discussed long term complications of uncontrolled diabetes and hypertension.   Return in about 2 weeks (around 03/22/2023) for medical conditions.  The patient was given clear instructions to go to ER or return to medical center if symptoms don't improve, worsen or new problems develop. The patient verbalized understanding. The patient was told to call to get lab results if they haven't heard anything in the next week.   This note has been created with Education officer, environmental. Any transcriptional errors are unintentional.   Grayce Sessions, NP 03/08/2023, 4:57 PM

## 2023-03-08 NOTE — Patient Instructions (Signed)

## 2023-03-15 ENCOUNTER — Other Ambulatory Visit (INDEPENDENT_AMBULATORY_CARE_PROVIDER_SITE_OTHER): Payer: Self-pay | Admitting: Primary Care

## 2023-03-15 ENCOUNTER — Telehealth (INDEPENDENT_AMBULATORY_CARE_PROVIDER_SITE_OTHER): Payer: Self-pay | Admitting: Primary Care

## 2023-03-15 DIAGNOSIS — L03319 Cellulitis of trunk, unspecified: Secondary | ICD-10-CM

## 2023-03-15 DIAGNOSIS — R600 Localized edema: Secondary | ICD-10-CM

## 2023-03-15 MED ORDER — BENZONATATE 100 MG PO CAPS: 100.0000 mg | ORAL_CAPSULE | Freq: Three times a day (TID) | ORAL | 0 refills | Status: DC | PRN

## 2023-03-15 NOTE — Telephone Encounter (Signed)
 Copied from CRM [id number]. Topic: Clinical - Medication Question >> Mar 15, 2023 10:39 AM Laurie Buckley wrote: Reason for CRM: Patient is experiencing a wet cough. Patient is coughing up mucus. Patient is currently taking over the counter medicine such as Robitussin that she started on Tuesday, 02/04. Patient is wanting to know if something can be prescribed to her for the cough.

## 2023-03-15 NOTE — Telephone Encounter (Signed)
 Called pt to schedule atp. Pt wanted to be prescribed something for her cough. Pt also said med that she was taking for severn days is not helping her. She said if it did not help she wanted pt to follow back up so she can move to something else.

## 2023-03-15 NOTE — Telephone Encounter (Signed)
 Please contact pt and schedule a virtual for cough

## 2023-03-16 ENCOUNTER — Other Ambulatory Visit (INDEPENDENT_AMBULATORY_CARE_PROVIDER_SITE_OTHER): Payer: Self-pay | Admitting: Primary Care

## 2023-03-19 ENCOUNTER — Other Ambulatory Visit: Payer: Self-pay | Admitting: *Deleted

## 2023-03-19 DIAGNOSIS — R601 Generalized edema: Secondary | ICD-10-CM

## 2023-03-21 ENCOUNTER — Ambulatory Visit (INDEPENDENT_AMBULATORY_CARE_PROVIDER_SITE_OTHER): Payer: Managed Care, Other (non HMO) | Admitting: Primary Care

## 2023-03-21 ENCOUNTER — Ambulatory Visit (HOSPITAL_COMMUNITY)
Admission: RE | Admit: 2023-03-21 | Discharge: 2023-03-21 | Disposition: A | Discharge status: Home / Self Care | Payer: 59 | Source: Ambulatory Visit | Attending: Vascular Surgery | Admitting: Vascular Surgery

## 2023-03-21 DIAGNOSIS — R601 Generalized edema: Secondary | ICD-10-CM

## 2023-03-22 ENCOUNTER — Ambulatory Visit (INDEPENDENT_AMBULATORY_CARE_PROVIDER_SITE_OTHER): Payer: Commercial Managed Care - HMO

## 2023-03-26 ENCOUNTER — Ambulatory Visit (INDEPENDENT_AMBULATORY_CARE_PROVIDER_SITE_OTHER): Payer: Commercial Managed Care - HMO | Admitting: Primary Care

## 2023-03-26 ENCOUNTER — Telehealth (INDEPENDENT_AMBULATORY_CARE_PROVIDER_SITE_OTHER): Payer: Self-pay | Admitting: Primary Care

## 2023-03-26 NOTE — Telephone Encounter (Signed)
 Called pt to see if pt wanted to some in sooner. Provider will not be in office after 4pm.

## 2023-04-02 ENCOUNTER — Encounter (INDEPENDENT_AMBULATORY_CARE_PROVIDER_SITE_OTHER): Payer: Self-pay | Admitting: Primary Care

## 2023-04-02 ENCOUNTER — Ambulatory Visit (INDEPENDENT_AMBULATORY_CARE_PROVIDER_SITE_OTHER): Payer: Commercial Managed Care - HMO | Admitting: Primary Care

## 2023-04-02 VITALS — BP 133/86 | HR 87 | Resp 16 | Ht 64.0 in | Wt 283.4 lb

## 2023-04-02 DIAGNOSIS — R7303 Prediabetes: Secondary | ICD-10-CM

## 2023-04-02 DIAGNOSIS — E66813 Obesity, class 3: Secondary | ICD-10-CM | POA: Diagnosis not present

## 2023-04-02 DIAGNOSIS — I10 Essential (primary) hypertension: Secondary | ICD-10-CM | POA: Diagnosis not present

## 2023-04-02 DIAGNOSIS — Z760 Encounter for issue of repeat prescription: Secondary | ICD-10-CM

## 2023-04-02 DIAGNOSIS — Z6841 Body Mass Index (BMI) 40.0 and over, adult: Secondary | ICD-10-CM

## 2023-04-02 LAB — POCT GLYCOSYLATED HEMOGLOBIN (HGB A1C): HbA1c, POC (controlled diabetic range): 7 % (ref 0.0–7.0)

## 2023-04-02 MED ORDER — LOSARTAN POTASSIUM-HCTZ 100-25 MG PO TABS
1.0000 | ORAL_TABLET | Freq: Every day | ORAL | 1 refills | Status: DC
Start: 2023-04-02 — End: 2023-10-24

## 2023-04-02 MED ORDER — AMLODIPINE BESYLATE 10 MG PO TABS: 10.0000 mg | ORAL_TABLET | Freq: Every day | ORAL | 1 refills | Status: DC

## 2023-04-02 MED ORDER — METFORMIN HCL 1000 MG PO TABS: 1000.0000 mg | ORAL_TABLET | Freq: Two times a day (BID) | ORAL | 1 refills | Status: DC

## 2023-04-02 NOTE — Patient Instructions (Signed)
 [facility]  Address: [address], [place], [place] [address] Phone: [phone]

## 2023-04-02 NOTE — Progress Notes (Signed)
 [facility]  Laurie Buckley, is a 59 y.o. female  [id number]  [id number]  DOB - 01/27/1966  Chief Complaint  Patient presents with   Diabetes   Hypertension       Subjective:   Laurie Buckley is a 58 y.o. female here today for a follow up visit. HTN-Patient has No headache, No chest pain, No abdominal pain - No Nausea, No new weakness tingling or numbness, No Cough - shortness of breath. T2D-Denies polyuria, polydipsia, polyphasia or vision changes.  Does not check blood sugars at home.  She has been under a lot of stress daughter left 03/23/23 unable find her. Left foot pain in heal and edema bilateral followed by vascular.  No problems updated.  Comprehensive ROS Pertinent positive and negative noted in HPI   No Known Allergies  Past Medical History:  Diagnosis Date   GERD (gastroesophageal reflux disease)    with certain foods   Hypertension    on meds   Sleep apnea    uses CPAP    Current Outpatient Medications on File Prior to Visit  Medication Sig Dispense Refill   albuterol (VENTOLIN HFA) 108 (90 Base) MCG/ACT inhaler Inhale 1 puff into the lungs every 6 (six) hours as needed for wheezing or shortness of breath. 8 g 0   furosemide (LASIX) 20 MG tablet Take 1 tablet (20 mg total) by mouth daily. 30 tablet 0   loratadine (CLARITIN) 10 MG tablet Take 1 tablet (10 mg total) by mouth daily. 90 tablet 2   UBIQUINOL PO Take 100 mg by mouth daily at 6 (six) AM.     [DISCONTINUED] LISINOPRIL PO Take by mouth.     No current facility-administered medications on file prior to visit.   Health Maintenance  Topic Date Due   Zoster (Shingles) Vaccine (1 of 2) Never done   Mammogram  03/31/2016   COVID-19 Vaccine (1 - 2024-25 season) Never done   Flu Shot  05/07/2023*   Pap with HPV screening  06/03/2023   DTaP/Tdap/Td vaccine (2 - Td or Tdap) 05/15/2030   Colon Cancer Screening  10/23/2030   Hepatitis C Screening  Completed   HIV Screening  Completed    HPV Vaccine  Aged Out  *Topic was postponed. The date shown is not the original due date.    Objective:   Vitals:   04/02/23 1534  BP: 133/86  Pulse: 87  Resp: 16  SpO2: 97%  Weight: 283 lb 6.4 oz (128.5 kg)  Height: 5\' 4"  (1.626 m)   BP Readings from Last 3 Encounters:  04/02/23 133/86  03/08/23 (!) 148/87  01/10/23 122/80      Physical Exam Vitals reviewed.  Constitutional:      Appearance: Normal appearance. She is obese.     Comments: morbid  HENT:     Head: Normocephalic.     Right Ear: Tympanic membrane, ear canal and external ear normal.     Left Ear: Tympanic membrane, ear canal and external ear normal.     Nose: Nose normal.     Mouth/Throat:     Mouth: Mucous membranes are moist.  Eyes:     Extraocular Movements: Extraocular movements intact.     Pupils: Pupils are equal, round, and reactive to light.  Cardiovascular:     Rate and Rhythm: Normal rate.  Pulmonary:     Effort: Pulmonary effort is normal.     Breath sounds: Normal breath sounds.  Abdominal:     General: Bowel sounds  are normal.     Palpations: Abdomen is soft.  Musculoskeletal:        General: Normal range of motion.     Cervical back: Normal range of motion.  Skin:    General: Skin is warm and dry.  Neurological:     Mental Status: She is alert and oriented to person, place, and time.  Psychiatric:        Mood and Affect: Mood normal.        Behavior: Behavior normal.        Thought Content: Thought content normal.     Assessment & Plan  Laurie Buckley was seen today for diabetes and hypertension.  Diagnoses and all orders for this visit:  type 2 diabetes - educated on lifestyle modifications, including but not limited to diet choices and adding exercise to daily routine.   -     POCT glycosylated hemoglobin (Hb A1C) -     CBC with Differential/Platelet -     Lipid panel -     Microalbumin / creatinine urine ratio  Essential hypertension BP goal - < 130/80 Explained that  having normal blood pressure is the goal and medications are helping to get to goal and maintain normal blood pressure. DIET: Limit salt intake, read nutrition labels to check salt content, limit fried and high fatty foods  Avoid using multisymptom OTC cold preparations that generally contain sudafed which can rise BP. Consult with pharmacist on best cold relief products to use for persons with HTN EXERCISE Discussed incorporating exercise such as walking - 30 minutes most days of the week and can do in 10 minute intervals    -     CMP14+EGFR  Class 3 severe obesity due to excess calories with serious comorbidity and body mass index (BMI) of 45.0 to 49.9 in adult [facility]) Morbid Obesity is > 40 indicating an excess in caloric intake or underlining conditions. This may lead to other co-morbidities. Educated on lifestyle modifications of diet and exercise which may reduce obesity.    Medication refill -     amLODipine (NORVASC) 10 MG tablet; Take 1 tablet (10 mg total) by mouth daily. -     losartan-hydrochlorothiazide (HYZAAR) 100-25 MG tablet; Take 1 tablet by mouth daily. -     metFORMIN (GLUCOPHAGE) 1000 MG tablet; Take 1 tablet (1,000 mg total) by mouth 2 (two) times daily with a meal.  Patient have been counseled extensively about nutrition and exercise. Other issues discussed during this visit include: low cholesterol diet, weight control and daily exercise, foot care, annual eye examinations at Ophthalmology, importance of adherence with medications and regular follow-up. We also discussed long term complications of uncontrolled diabetes and hypertension.   Return in about 3 months (around 06/30/2023).  The patient was given clear instructions to go to ER or return to medical center if symptoms don't improve, worsen or new problems develop. The patient verbalized understanding. The patient was told to call to get lab results if they haven't heard anything in the next week.   This note has been  created with Education officer, environmental. Any transcriptional errors are unintentional.   Grayce Sessions, NP 04/02/2023, 4:16 PM

## 2023-04-03 LAB — CMP14+EGFR
ALT: 13 IU/L (ref 0–32)
AST: 23 IU/L (ref 0–40)
Albumin: 4.5 g/dL (ref 3.8–4.9)
Alkaline Phosphatase: 71 IU/L (ref 44–121)
BUN/Creatinine Ratio: 18 (ref 9–23)
BUN: 19 mg/dL (ref 6–24)
Bilirubin Total: 0.3 mg/dL (ref 0.0–1.2)
CO2: 24 mmol/L (ref 20–29)
Calcium: 9.3 mg/dL (ref 8.7–10.2)
Chloride: 103 mmol/L (ref 96–106)
Creatinine, Ser: 1.07 mg/dL — ABNORMAL HIGH (ref 0.57–1.00)
Globulin, Total: 3.3 g/dL (ref 1.5–4.5)
Glucose: 99 mg/dL (ref 70–99)
Potassium: 4.1 mmol/L (ref 3.5–5.2)
Sodium: 141 mmol/L (ref 134–144)
Total Protein: 7.8 g/dL (ref 6.0–8.5)
eGFR: 60 mL/min/{1.73_m2} (ref >59)

## 2023-04-03 LAB — CBC WITH DIFFERENTIAL/PLATELET
Basophils Absolute: 0 10*3/uL (ref 0.0–0.2)
Basos: 0 %
EOS (ABSOLUTE): 0.1 10*3/uL (ref 0.0–0.4)
Eos: 2 %
Hematocrit: 43.5 % (ref 34.0–46.6)
Hemoglobin: 14.1 g/dL (ref 11.1–15.9)
Immature Grans (Abs): 0 10*3/uL (ref 0.0–0.1)
Immature Granulocytes: 0 %
Lymphocytes Absolute: 4.2 10*3/uL — ABNORMAL HIGH (ref 0.7–3.1)
Lymphs: 57 %
MCH: 29.4 pg (ref 26.6–33.0)
MCHC: 32.4 g/dL (ref 31.5–35.7)
MCV: 91 fL (ref 79–97)
Monocytes Absolute: 0.5 10*3/uL (ref 0.1–0.9)
Monocytes: 7 %
Neutrophils Absolute: 2.5 10*3/uL (ref 1.4–7.0)
Neutrophils: 34 %
Platelets: 290 10*3/uL (ref 150–450)
RBC: 4.8 x10E6/uL (ref 3.77–5.28)
RDW: 12.8 % (ref 11.7–15.4)
WBC: 7.3 10*3/uL (ref 3.4–10.8)

## 2023-04-03 LAB — MICROALBUMIN / CREATININE URINE RATIO
Creatinine, Urine: 135.6 mg/dL
Microalb/Creat Ratio: 4 mg/g{creat} (ref 0–29)
Microalbumin, Urine: 4.8 ug/mL

## 2023-04-03 LAB — LIPID PANEL
Chol/HDL Ratio: 3.8 ratio (ref 0.0–4.4)
Cholesterol, Total: 144 mg/dL (ref 100–199)
HDL: 38 mg/dL — ABNORMAL LOW (ref >39)
LDL Chol Calc (NIH): 80 mg/dL (ref 0–99)
Triglycerides: 151 mg/dL — ABNORMAL HIGH (ref 0–149)
VLDL Cholesterol Cal: 26 mg/dL (ref 5–40)

## 2023-04-04 ENCOUNTER — Ambulatory Visit (INDEPENDENT_AMBULATORY_CARE_PROVIDER_SITE_OTHER): Payer: 59 | Admitting: Physician Assistant

## 2023-04-04 VITALS — BP 128/82 | HR 88 | Temp 98.3°F | Resp 22 | Ht 64.0 in | Wt 283.1 lb

## 2023-04-04 DIAGNOSIS — I872 Venous insufficiency (chronic) (peripheral): Secondary | ICD-10-CM

## 2023-04-04 DIAGNOSIS — M7989 Other specified soft tissue disorders: Secondary | ICD-10-CM | POA: Diagnosis not present

## 2023-04-04 NOTE — Progress Notes (Signed)
 Requested by:  Grayce Sessions, NP [address] [place],  [place] [address]  Reason for consultation: lower extremity edema    History of Present Illness   Laurie Buckley is a 58 y.o. ([birth year]) female who presents for evaluation of bilateral lower extremity edema, right greater than left.  The patient states she has noticed mild edema of both of her legs for years now after prolonged sitting and standing.  She notes that her swelling started getting worse back in December.  She says her legs are still usually normal in the mornings but get more swollen by the end of the day. This causes her legs to feel "tight" and "uncomfortable".   Her swelling is worsened by prolonged periods of sitting and standing.  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  She has been wearing compression stockings her family gave her and thinks these help her a little bit.  She tries to elevate her legs intermittently, but does not elevate them above her heart.  She denies any prior history of vein procedures or history of DVT.  She denies any itching, varicose veins, bleeding, or ulcerations.  Past Medical History:  Diagnosis Date   GERD (gastroesophageal reflux disease)    with certain foods   Hypertension    on meds   Sleep apnea    uses CPAP    Past Surgical History:  Procedure Laterality Date   TUBAL LIGATION  1996   WISDOM TOOTH EXTRACTION      Social History   Socioeconomic History   Marital status: Single    Spouse name: Not on file   Number of children: Not on file   Years of education: Not on file   Highest education level: Bachelor's degree (e.g., BA, AB, BS)  Occupational History   Not on file  Tobacco Use   Smoking status: Never   Smokeless tobacco: Never  Vaping Use   Vaping status: Never Used  Substance and Sexual Activity   Alcohol use: No   Drug use: Never   Sexual activity: Yes  Other Topics Concern   Not on file  Social History  Narrative   Not on file   Social Drivers of Health   Financial Resource Strain: Low Risk  (03/31/2023)   Overall Financial Resource Strain (CARDIA)    Difficulty of Paying Living Expenses: Not hard at all  Food Insecurity: No Food Insecurity (03/31/2023)   Hunger Vital Sign    Worried About Running Out of Food in the Last Year: Never true    Ran Out of Food in the Last Year: Never true  Transportation Needs: No Transportation Needs (03/31/2023)   PRAPARE - Administrator, Civil Service (Medical): No    Lack of Transportation (Non-Medical): No  Physical Activity: Insufficiently Active (03/31/2023)   Exercise Vital Sign    Days of Exercise per Week: 3 days    Minutes of Exercise per Session: 20 min  Stress: No Stress Concern Present (03/31/2023)   [employer] of Occupational Health - Occupational Stress Questionnaire    Feeling of Stress : Not at all  Social Connections: Moderately Integrated (03/31/2023)   Social Connection and Isolation Panel [NHANES]    Frequency of Communication with Friends and Family: Three times a week    Frequency of Social Gatherings with Friends and Family: Twice a week    Attends Religious Services: More than 4 times per year  Active Member of Clubs or Organizations: Yes    Attends Banker Meetings: More than 4 times per year    Marital Status: Never married  Intimate Partner Violence: Not At Risk (12/19/2022)   Humiliation, Afraid, Rape, and Kick questionnaire    Fear of Current or Ex-Partner: No    Emotionally Abused: No    Physically Abused: No    Sexually Abused: No    Family History  Problem Relation Age of Onset   Colon polyps Brother 26   Colon cancer Maternal Uncle    Esophageal cancer Neg Hx    Rectal cancer Neg Hx    Stomach cancer Neg Hx     Current Outpatient Medications  Medication Sig Dispense Refill   albuterol (VENTOLIN HFA) 108 (90 Base) MCG/ACT inhaler Inhale 1 puff into the lungs every 6 (six)  hours as needed for wheezing or shortness of breath. 8 g 0   amLODipine (NORVASC) 10 MG tablet Take 1 tablet (10 mg total) by mouth daily. 90 tablet 1   furosemide (LASIX) 20 MG tablet Take 1 tablet (20 mg total) by mouth daily. 30 tablet 0   loratadine (CLARITIN) 10 MG tablet Take 1 tablet (10 mg total) by mouth daily. 90 tablet 2   losartan-hydrochlorothiazide (HYZAAR) 100-25 MG tablet Take 1 tablet by mouth daily. 90 tablet 1   metFORMIN (GLUCOPHAGE) 1000 MG tablet Take 1 tablet (1,000 mg total) by mouth 2 (two) times daily with a meal. 180 tablet 1   UBIQUINOL PO Take 100 mg by mouth daily at 6 (six) AM.     No current facility-administered medications for this visit.    No Known Allergies  REVIEW OF SYSTEMS (negative unless checked):   Cardiac:  []  Chest pain or chest pressure? []  Shortness of breath upon activity? []  Shortness of breath when lying flat? []  Irregular heart rhythm?  Vascular:  []  Pain in calf, thigh, or hip brought on by walking? []  Pain in feet at night that wakes you up from your sleep? []  Blood clot in your veins? [x]  Leg swelling?  Pulmonary:  []  Oxygen at home? []  Productive cough? []  Wheezing?  Neurologic:  []  Sudden weakness in arms or legs? []  Sudden numbness in arms or legs? []  Sudden onset of difficult speaking or slurred speech? []  Temporary loss of vision in one eye? []  Problems with dizziness?  Gastrointestinal:  []  Blood in stool? []  Vomited blood?  Genitourinary:  []  Burning when urinating? []  Blood in urine?  Psychiatric:  []  Major depression  Hematologic:  []  Bleeding problems? []  Problems with blood clotting?  Dermatologic:  []  Rashes or ulcers?  Constitutional:  []  Fever or chills?  Ear/Nose/Throat:  []  Change in hearing? []  Nose bleeds? []  Sore throat?  Musculoskeletal:  []  Back pain? []  Joint pain? []  Muscle pain?   Physical Examination     Vitals:   04/04/23 0839  BP: 128/82  Pulse: 88  Resp: (!)  22  Temp: 98.3 F (36.8 C)  TempSrc: Temporal  SpO2: 91%  Weight: 283 lb 1.6 oz (128.4 kg)  Height: 5\' 4"  (1.626 m)   Body mass index is 48.59 kg/m.  General:  WDWN in NAD; vital signs documented above Gait: Not observed HENT: WNL, normocephalic Pulmonary: normal non-labored breathing, without rales, rhonchi, wheezing Cardiac: regular Abdomen: soft, NT, no masses Skin: without rashes Vascular Exam/Pulses: palpable PT pulses bilaterally Extremities: without varicose veins, without reticular veins, with 1+ edema of RLE, without stasis pigmentation, without lipodermatosclerosis,  without ulcers Musculoskeletal: no muscle wasting or atrophy  Neurologic: A&O X 3;  no focal weakness or paresthesias are detected Psychiatric:  The pt has Normal affect.  Non-invasive Vascular Imaging   RLE Venous Insufficiency Duplex (03/21/2023):  +--------------+---------+------+-----------+------------+--------+  RIGHT        Reflux NoRefluxReflux TimeDiameter cmsComments                          Yes                                   +--------------+---------+------+-----------+------------+--------+  CFV                    yes   >1 second                       +--------------+---------+------+-----------+------------+--------+  GSV at SFJ              yes    >500 ms      1.20              +--------------+---------+------+-----------+------------+--------+  GSV prox thighno                            0.80              +--------------+---------+------+-----------+------------+--------+  GSV mid thigh no                            0.50              +--------------+---------+------+-----------+------------+--------+  GSV dist thighno                            0.40              +--------------+---------+------+-----------+------------+--------+  GSV at knee   no                            0.45               +--------------+---------+------+-----------+------------+--------+  GSV prox calf no                            0.40              +--------------+---------+------+-----------+------------+--------+  GSV mid calf  no                            0.30              +--------------+---------+------+-----------+------------+--------+  SSV Pop Fossa no                            0.20              +--------------+---------+------+-----------+------------+--------+  SSV prox calf no                            0.13              +--------------+---------+------+-----------+------------+--------+  SSV mid calf  no  0.20              +--------------+---------+------+-----------+------------+--------+    Medical Decision Making   Lisanne A Reetz is a 58 y.o. female who presents with lower extremity edema  Based on the patient's duplex, there is reflux in the right common femoral vein and greater saphenous vein at the saphenofemoral junction.  The remainder of the deep and superficial venous system on the right is competent.  There is no evidence of DVT or SVT on duplex The patient describes worsening of bilateral lower extremity edema, starting in December.  Her edema is worse on the right than the left.  This causes her legs to feel tight and uncomfortable She has previously attempted non-medical grade compression stockings, which offer her mild benefit.  She currently elevates her legs intermittently, but not above her heart.  She notes no significant improvement with her current leg elevation On exam the patient has 1+ edema in the right lower leg.  She has no significant edema in the left lower leg.  She has no active ulcerations or varicose veins I have explained to the patient that she would not be a candidate for greater saphenous vein ablation given that it is only incompetent at the saphenofemoral junction.  I also do not believe that  ablation would help her swelling much, given that she has deep reflux as well.  I have encouraged her that her symptoms should improve with conservative treatment, including daily use of compression stockings, weight loss, avoiding prolonged sitting and standing, and proper leg elevation.  I have educated her on how to properly elevate her legs.  She has also been measured for 20 to 30 mmHg knee-high compression stockings She can follow-up with our office as needed   Ernestene Mention, PA-C [facility] Office: [phone]  04/04/2023, 8:45 AM  Clinic MD: Randie Heinz

## 2023-04-06 ENCOUNTER — Encounter (INDEPENDENT_AMBULATORY_CARE_PROVIDER_SITE_OTHER): Payer: Self-pay | Admitting: Primary Care

## 2023-07-01 ENCOUNTER — Encounter (HOSPITAL_COMMUNITY): Payer: Self-pay

## 2023-07-01 ENCOUNTER — Ambulatory Visit (INDEPENDENT_AMBULATORY_CARE_PROVIDER_SITE_OTHER)

## 2023-07-01 ENCOUNTER — Ambulatory Visit (HOSPITAL_COMMUNITY)
Admission: EM | Admit: 2023-07-01 | Discharge: 2023-07-01 | Disposition: A | Discharge status: Home / Self Care | Attending: Emergency Medicine | Admitting: Emergency Medicine

## 2023-07-01 DIAGNOSIS — W19XXXA Unspecified fall, initial encounter: Secondary | ICD-10-CM

## 2023-07-01 DIAGNOSIS — M25561 Pain in right knee: Secondary | ICD-10-CM

## 2023-07-01 MED ORDER — MELOXICAM 7.5 MG PO TABS: 7.5000 mg | ORAL_TABLET | Freq: Every day | ORAL | 0 refills | Status: DC

## 2023-07-01 NOTE — ED Triage Notes (Signed)
 Patient here today with c/o right knee pain after falling 2 weeks ago. Patient states that she was playing around with someone and lost her balance. Patient has been using doing RICE therapy with some relief.

## 2023-07-01 NOTE — Discharge Instructions (Addendum)
 Your x-ray is negative for any fracture or underlying injury.  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 You can take meloxicam  once daily to help with your pain.  Do not take this with other NSAIDs including ibuprofen, Motrin, Advil, Aleve, and naproxen. You can take 500 mg of Tylenol  every 6-8 hours as needed for breakthrough pain.  Do not exceed 4000 mg in a day. We have provided you with a knee brace here that you can wear as needed for comfort or any swelling. Continue to rest ice and elevate to help with the pain and swelling. I have attached [facility] that you can follow-up with if your pain continues for further evaluation and management. Return here as needed.

## 2023-07-01 NOTE — ED Provider Notes (Addendum)
 [facility]    CSN: [id number] Arrival date & time: 07/01/23  1141      History   Chief Complaint Chief Complaint  Patient presents with   Fall    HPI Laurie Buckley is a 58 y.o. female.   Patient presents with right knee pain for a little over 2 weeks.  Patient states on 5/8 she was running around and playing with her niece when she lost her balance and fell on her right knee.  Patient states that she has been doing RICE therapy with some relief.  Patient denies any difficulty walking, but does endorse some pain with walking and movement.  Patient has also noticed some swelling to her knee.  Patient unable to pinpoint exact area of pain and states that the whole area surrounding the knee is where she is having pain.  Patient states that when she is lying down at night she does notice some worsening pain to the medial aspect of her knee.  The history is provided by the patient and medical records.  Fall    Past Medical History:  Diagnosis Date   GERD (gastroesophageal reflux disease)    with certain foods   Hypertension    on meds   Sleep apnea    uses CPAP    There are no active problems to display for this patient.   Past Surgical History:  Procedure Laterality Date   TUBAL LIGATION  1996   WISDOM TOOTH EXTRACTION      OB History   No obstetric history on file.      Home Medications    Prior to Admission medications   Medication Sig Start Date End Date Taking? Authorizing Provider  meloxicam  (MOBIC ) 7.5 MG tablet Take 1 tablet (7.5 mg total) by mouth daily. 07/01/23  Yes Rosevelt Constable, Lizzete Gough A, NP  albuterol  (VENTOLIN  HFA) 108 (90 Base) MCG/ACT inhaler Inhale 1 puff into the lungs every 6 (six) hours as needed for wheezing or shortness of breath. 12/19/22   Marius Siemens, NP  amLODipine  (NORVASC ) 10 MG tablet Take 1 tablet (10 mg total) by mouth daily. 04/02/23   Marius Siemens, NP  furosemide  (LASIX ) 20 MG tablet Take 1 tablet (20 mg  total) by mouth daily. Patient not taking: Reported on 07/01/2023 03/08/23   Marius Siemens, NP  loratadine  (CLARITIN ) 10 MG tablet Take 1 tablet (10 mg total) by mouth daily. 12/19/22   Marius Siemens, NP  losartan -hydrochlorothiazide (HYZAAR) 100-25 MG tablet Take 1 tablet by mouth daily. 04/02/23   Marius Siemens, NP  metFORMIN  (GLUCOPHAGE ) 1000 MG tablet Take 1 tablet (1,000 mg total) by mouth 2 (two) times daily with a meal. 04/02/23   Marius Siemens, NP  LISINOPRIL PO Take by mouth.  03/10/20  [provider]    Family History Family History  Problem Relation Age of Onset   Colon polyps Brother 31   Colon cancer Maternal Uncle    Esophageal cancer Neg Hx    Rectal cancer Neg Hx    Stomach cancer Neg Hx     Social History Social History   Tobacco Use   Smoking status: Never   Smokeless tobacco: Never  Vaping Use   Vaping status: Never Used  Substance Use Topics   Alcohol use: No   Drug use: Never     Allergies   Patient has no known allergies.   Review of Systems Review of Systems  Per HPI  Physical Exam Triage  Vital Signs ED Triage Vitals [07/01/23 1156]  Encounter Vitals Group     BP      Systolic BP Percentile      Diastolic BP Percentile      Pulse      Resp      Temp      Temp src      SpO2      Weight      Height      Head Circumference      Peak Flow      Pain Score 7     Pain Loc      Pain Education      Exclude from Growth Chart    No data found.  Updated Vital Signs BP (!) 161/108 (BP Location: Right Arm)   Pulse 96   Temp 99.1 F (37.3 C) (Oral)   Resp 16   LMP  (LMP Unknown)   SpO2 94%   Visual Acuity Right Eye Distance:   Left Eye Distance:   Bilateral Distance:    Right Eye Near:   Left Eye Near:    Bilateral Near:     Physical Exam Vitals and nursing note reviewed.  Constitutional:      General: She is awake. She is not in acute distress.    Appearance: Normal appearance. She is  well-developed and well-groomed. She is not ill-appearing.  Musculoskeletal:     Right knee: No deformity, effusion or erythema. Normal range of motion. Tenderness present over the MCL and LCL.  Skin:    General: Skin is warm and dry.  Neurological:     Mental Status: She is alert.  Psychiatric:        Behavior: Behavior is cooperative.      UC Treatments / Results  Labs (all labs ordered are listed, but only abnormal results are displayed) Labs Reviewed - No data to display  EKG   Radiology DG Knee Complete 4 Views Right Result Date: 07/01/2023 CLINICAL DATA:  Patient complains of right knee pain after falling 2 weeks ago. EXAM: RIGHT KNEE - COMPLETE 4+ VIEW COMPARISON:  None Available. FINDINGS: There is no joint effusion. Sharpening of the tibial spines with mild medial compartment narrowing and marginal spur formation. No fracture or dislocation. IMPRESSION: 1. No acute findings. 2. Mild medial compartment osteoarthritis. Electronically Signed   By: Kimberley Penman M.D.   On: 07/01/2023 12:36    Procedures Procedures (including critical care time)  Medications Ordered in UC Medications - No data to display  Initial Impression / Assessment and Plan / UC Course  I have reviewed the triage vital signs and the nursing notes.  Pertinent labs & imaging results that were available during my care of the patient were reviewed by me and considered in my medical decision making (see chart for details).     Patient is well-appearing. Upon assessment there is mild tenderness present over MCL and LCL.  Without decreased range of motion, obvious deformity, effusion, or erythema.  X-ray ordered.  Patient my interpretation there is no acute osseous abnormality.  Radiology report confirms this and does reveal some mild medial compartment osteoarthritis.  Provided with knee sleeve.  Prescribed meloxicam .  Recommended taking Tylenol  as needed for breakthrough pain.  Discussed RICE therapy.   Given orthopedic follow-up if needed.  Discussed return precautions. Final Clinical Impressions(s) / UC Diagnoses   Final diagnoses:  Acute pain of right knee  Fall, initial encounter     Discharge Instructions  Your x-ray is negative for any fracture or underlying injury.  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 You can take meloxicam  once daily to help with your pain.  Do not take this with other NSAIDs including ibuprofen, Motrin, Advil, Aleve, and naproxen. You can take 500 mg of Tylenol  every 6-8 hours as needed for breakthrough pain.  Do not exceed 4000 mg in a day. We have provided you with a knee brace here that you can wear as needed for comfort or any swelling. Continue to rest ice and elevate to help with the pain and swelling. I have attached [facility] that you can follow-up with if your pain continues for further evaluation and management. Return here as needed.   ED Prescriptions     Medication Sig Dispense Auth. Provider   meloxicam  (MOBIC ) 7.5 MG tablet Take 1 tablet (7.5 mg total) by mouth daily. 30 tablet Levora Reas A, NP      PDMP not reviewed this encounter.   Karon Packer, NP 07/01/23 1308    Levora Reas A, NP 07/01/23 1308

## 2023-07-03 ENCOUNTER — Ambulatory Visit (INDEPENDENT_AMBULATORY_CARE_PROVIDER_SITE_OTHER): Payer: Commercial Managed Care - HMO | Admitting: Primary Care

## 2023-07-11 ENCOUNTER — Encounter (INDEPENDENT_AMBULATORY_CARE_PROVIDER_SITE_OTHER): Payer: Self-pay | Admitting: Primary Care

## 2023-07-11 ENCOUNTER — Ambulatory Visit (INDEPENDENT_AMBULATORY_CARE_PROVIDER_SITE_OTHER): Admitting: Primary Care

## 2023-07-11 VITALS — BP 135/88 | HR 98 | Resp 16 | Wt 284.2 lb

## 2023-07-11 DIAGNOSIS — E119 Type 2 diabetes mellitus without complications: Secondary | ICD-10-CM | POA: Diagnosis not present

## 2023-07-11 DIAGNOSIS — R7303 Prediabetes: Secondary | ICD-10-CM

## 2023-07-11 LAB — POCT GLYCOSYLATED HEMOGLOBIN (HGB A1C): HbA1c, POC (controlled diabetic range): 7.1 % — AB (ref 0.0–7.0)
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
## 2023-10-11 ENCOUNTER — Ambulatory Visit (INDEPENDENT_AMBULATORY_CARE_PROVIDER_SITE_OTHER): Admitting: Primary Care

## 2023-10-11 ENCOUNTER — Encounter (INDEPENDENT_AMBULATORY_CARE_PROVIDER_SITE_OTHER): Payer: Self-pay | Admitting: Primary Care

## 2023-10-11 VITALS — BP 112/72 | HR 89 | Resp 20 | Ht 64.0 in | Wt 276.9 lb

## 2023-10-11 DIAGNOSIS — Z23 Encounter for immunization: Secondary | ICD-10-CM

## 2023-10-11 DIAGNOSIS — N939 Abnormal uterine and vaginal bleeding, unspecified: Secondary | ICD-10-CM | POA: Diagnosis not present

## 2023-10-11 DIAGNOSIS — Z1231 Encounter for screening mammogram for malignant neoplasm of breast: Secondary | ICD-10-CM

## 2023-10-11 DIAGNOSIS — E119 Type 2 diabetes mellitus without complications: Secondary | ICD-10-CM | POA: Diagnosis not present

## 2023-10-11 LAB — POCT GLYCOSYLATED HEMOGLOBIN (HGB A1C): Hemoglobin A1C: 7.2 % — AB (ref 4.0–5.6)

## 2023-10-11 NOTE — Progress Notes (Signed)
 Long cycle

## 2023-10-11 NOTE — Progress Notes (Unsigned)
 Subjective:  Patient ID: Laurie Buckley, female    DOB: 1965/11/03  Age: 58 y.o. MRN: [id number]  CC: Diabetes   Laurie Buckley presents for follow-up of diabetes. Patient does not check blood sugar at home Diabetes    Compliant with meds - Yes Checking CBGs? No  Fasting avg -   Postprandial average -  Exercising regularly? - Yes Watching carbohydrate intake? - Yes Neuropathy ? - No Hypoglycemic events - No  - Recovers with :   Pertinent ROS:  Polyuria - Yes Polydipsia - Yes Vision problems - Yes  Medications as noted below. Taking them regularly without complication/adverse reaction being reported today.   History [name] has a past medical history of GERD (gastroesophageal reflux disease), Hypertension, and Sleep apnea.   She has a past surgical history that includes Wisdom tooth extraction and Tubal ligation (1996).   Her family history includes Colon cancer in her maternal uncle; Colon polyps (age of onset: 50) in her brother.She reports that she has never smoked. She has never used smokeless tobacco. She reports that she does not drink alcohol and does not use drugs.  Current Outpatient Medications on File Prior to Visit  Medication Sig Dispense Refill   albuterol  (VENTOLIN  HFA) 108 (90 Base) MCG/ACT inhaler Inhale 1 puff into the lungs every 6 (six) hours as needed for wheezing or shortness of breath. 8 g 0   amLODipine  (NORVASC ) 10 MG tablet Take 1 tablet (10 mg total) by mouth daily. 90 tablet 1   losartan -hydrochlorothiazide (HYZAAR) 100-25 MG tablet Take 1 tablet by mouth daily. 90 tablet 1   meloxicam  (MOBIC ) 7.5 MG tablet Take 1 tablet (7.5 mg total) by mouth daily. 30 tablet 0   metFORMIN  (GLUCOPHAGE ) 1000 MG tablet Take 1 tablet (1,000 mg total) by mouth 2 (two) times daily with a meal. 180 tablet 1   furosemide  (LASIX ) 20 MG tablet Take 1 tablet (20 mg total) by mouth daily. (Patient not taking: Reported on 10/11/2023) 30 tablet 0   loratadine  (CLARITIN ) 10  MG tablet Take 1 tablet (10 mg total) by mouth daily. (Patient not taking: Reported on 10/11/2023) 90 tablet 2   [DISCONTINUED] LISINOPRIL PO Take by mouth.     No current facility-administered medications on file prior to visit.    Review of Systems Comprehensive ROS Pertinent positive and negative noted in HPI   Objective:  BP 112/72 (BP Location: Left Arm, Patient Position: Sitting, Cuff Size: Large)   Pulse 89   Resp 20   Ht 5' 4 (1.626 m)   Wt 276 lb 14.4 oz (125.6 kg)   LMP 09/07/2023 (Approximate)   SpO2 100%   BMI 47.53 kg/m   BP Readings from Last 3 Encounters:  10/11/23 112/72  07/11/23 135/88  07/01/23 (!) 161/108    Wt Readings from Last 3 Encounters:  10/11/23 276 lb 14.4 oz (125.6 kg)  07/11/23 284 lb 3.2 oz (128.9 kg)  04/04/23 283 lb 1.6 oz (128.4 kg)    Physical Exam Vitals reviewed.  Constitutional:      Appearance: Normal appearance. She is obese.  HENT:     Head: Normocephalic.     Right Ear: Tympanic membrane, ear canal and external ear normal.     Left Ear: Tympanic membrane, ear canal and external ear normal.     Nose: Nose normal.     Mouth/Throat:     Mouth: Mucous membranes are moist.  Eyes:     Extraocular Movements: Extraocular movements intact.  Pupils: Pupils are equal, round, and reactive to light.  Cardiovascular:     Rate and Rhythm: Normal rate and regular rhythm.  Pulmonary:     Effort: Pulmonary effort is normal.     Breath sounds: Normal breath sounds.  Abdominal:     General: Bowel sounds are normal. There is distension.     Palpations: Abdomen is soft.  Musculoskeletal:        General: Normal range of motion.     Cervical back: Normal range of motion and neck supple.  Skin:    General: Skin is warm and dry.  Neurological:     Mental Status: She is alert and oriented to person, place, and time.  Psychiatric:        Mood and Affect: Mood normal.        Behavior: Behavior normal.        Thought Content: Thought  content normal.     Lab Results  Component Value Date   HGBA1C 7.2 (A) 10/11/2023   HGBA1C 7.1 (A) 07/11/2023   HGBA1C 7.0 04/02/2023    Lab Results  Component Value Date   WBC 7.3 04/02/2023   HGB 14.1 04/02/2023   HCT 43.5 04/02/2023   PLT 290 04/02/2023   GLUCOSE 99 04/02/2023   CHOL 144 04/02/2023   TRIG 151 (H) 04/02/2023   HDL 38 (L) 04/02/2023   LDLCALC 80 04/02/2023   ALT 13 04/02/2023   AST 23 04/02/2023   NA 141 04/02/2023   K 4.1 04/02/2023   CL 103 04/02/2023   CREATININE 1.07 (H) 04/02/2023   BUN 19 04/02/2023   CO2 24 04/02/2023   HGBA1C 7.2 (A) 10/11/2023    Title   Diabetic Foot Exam - detailed    Semmes-Weinstein Monofilament Test + means has sensation and - means no sensation      Image components are not supported.   Image components are not supported. Image components are not supported.  T[place] Comments      Assessment & Plan:  Claudine was seen today for diabetes.  Diagnoses and all orders for this visit:  Type 2 diabetes mellitus without complication, without long-term current use of insulin (HCC) -     POCT glycosylated hemoglobin (Hb A1C) - educated on lifestyle modifications, including but not limited to diet choices and adding exercise to daily routine.    Need for influenza vaccination -     Flu vaccine trivalent PF, 6mos and older(Flulaval,Afluria,Fluarix,Fluzone)  Encounter for screening mammogram for malignant neoplasm of breast -     MM 3D DIAGNOSTIC MAMMOGRAM BILATERAL BREAST; Future   Abnormal uterine bleeding (AUB)  Ambulatory referral to Gynecology   Follow-up:  Return in about 3 months (around 01/10/2024).  The above assessment and management plan was discussed with the patient. The patient verbalized understanding of and has agreed to the management plan. Patient is aware to call the clinic if symptoms fail to improve or worsen. Patient is aware when to return to the clinic for a follow-up visit.  Patient educated on when it is appropriate to go to the emergency department.   [name], NP-C

## 2023-10-12 ENCOUNTER — Other Ambulatory Visit (INDEPENDENT_AMBULATORY_CARE_PROVIDER_SITE_OTHER): Payer: Self-pay

## 2023-10-12 ENCOUNTER — Telehealth (INDEPENDENT_AMBULATORY_CARE_PROVIDER_SITE_OTHER): Payer: Self-pay | Admitting: Primary Care

## 2023-10-12 DIAGNOSIS — Z1231 Encounter for screening mammogram for malignant neoplasm of breast: Secondary | ICD-10-CM

## 2023-10-12 NOTE — Telephone Encounter (Signed)
 Copied from CRM [id number]. Topic: Referral - Question >> Oct 12, 2023  8:34 AM Ivette P wrote: Reason for CRM: Kathern called in because received a referral for pt for a diagnostic mammogram    Diagnostic mammogram and is a screening and trying to get clarifications   Callback - [phone] - secured Line

## 2023-10-12 NOTE — Telephone Encounter (Signed)
 Order has been completed.

## 2023-10-22 ENCOUNTER — Ambulatory Visit: Payer: Self-pay | Admitting: *Deleted

## 2023-10-22 ENCOUNTER — Other Ambulatory Visit (INDEPENDENT_AMBULATORY_CARE_PROVIDER_SITE_OTHER): Payer: Self-pay | Admitting: Primary Care

## 2023-10-22 MED ORDER — BENZONATATE 100 MG PO CAPS: 200.0000 mg | ORAL_CAPSULE | Freq: Three times a day (TID) | ORAL | 0 refills | Status: DC | PRN

## 2023-10-22 NOTE — Telephone Encounter (Signed)
 FYI Only or Action Required?: Action required by provider: request for appointment and scheduled with other provider.  Patient was last seen in primary care on 10/11/2023 by Celestia Rosaline SQUIBB, NP.  Called Nurse Triage reporting Cough.  Symptoms began several days ago.  Interventions attempted: OTC medications: day quil nightquil .  Symptoms are: gradually worsening.  Triage Disposition: See PCP When Office is Open (Within 3 Days)  Patient/caregiver understands and will follow disposition?: Yes             Copied from CRM #[id number]. Topic: Clinical - Red Word Triage >> Oct 22, 2023 10:14 AM Harlene ORN wrote: Red Word that prompted transfer to Nurse Triage: New within a week: cold that started thursday last week runny nose, stuffiness, coughing negative for Covid Reason for Disposition  Cough has been present for > 3 weeks    Less than 1 week  Answer Assessment - Initial Assessment Questions No available appt with PCP until Oct. Schedulded appt with other provider and gave address for tomorrow. Patient had to call out of work today due to worsening sx and OTC medications day/nightquil not effective. No chest pain no SOB. Requesting medication for cough as has had in the past benzonatate . Mobile unit not available this week.     1. ONSET: When did the cough begin?      Last Thursday  2. SEVERITY: How bad is the cough today?      Same  3. SPUTUM: Describe the color of your sputum (e.g., none, dry cough; clear, white, yellow, green)     Yellow  4. HEMOPTYSIS: Are you coughing up any blood? If Yes, ask: How much? (e.g., flecks, streaks, tablespoons, etc.)     na 5. DIFFICULTY BREATHING: Are you having difficulty breathing? If Yes, ask: How bad is it? (e.g., mild, moderate, severe)      Normal  6. FEVER: Do you have a fever? If Yes, ask: What is your temperature, how was it measured, and when did it start?     na 7. CARDIAC HISTORY: Do you have any  history of heart disease? (e.g., heart attack, congestive heart failure)      na 8. LUNG HISTORY: Do you have any history of lung disease?  (e.g., pulmonary embolus, asthma, emphysema)     na 9. PE RISK FACTORS: Do you have a history of blood clots? (or: recent major surgery, recent prolonged travel, bedridden)     na 10. OTHER SYMPTOMS: Do you have any other symptoms? (e.g., runny nose, wheezing, chest pain)       Runny nose stuffiness, cough yellow sputum hoarse voice 11. PREGNANCY: Is there any chance you are pregnant? When was your last menstrual period?       na 12. TRAVEL: Have you traveled out of the country in the last month? (e.g., travel history, exposures)       na  Protocols used: Cough - Acute Productive-A-AH

## 2023-10-23 ENCOUNTER — Encounter: Payer: Self-pay | Admitting: Nurse Practitioner

## 2023-10-23 ENCOUNTER — Ambulatory Visit: Payer: Self-pay | Admitting: Nurse Practitioner

## 2023-10-23 ENCOUNTER — Encounter (INDEPENDENT_AMBULATORY_CARE_PROVIDER_SITE_OTHER): Payer: Self-pay | Admitting: Primary Care

## 2023-10-23 VITALS — BP 115/76 | HR 89 | Temp 96.8°F | Wt 275.0 lb

## 2023-10-23 DIAGNOSIS — R051 Acute cough: Secondary | ICD-10-CM | POA: Insufficient documentation

## 2023-10-23 DIAGNOSIS — R059 Cough, unspecified: Secondary | ICD-10-CM

## 2023-10-23 LAB — POC COVID19 BINAXNOW: SARS Coronavirus 2 Ag: NEGATIVE

## 2023-10-23 LAB — POCT INFLUENZA A/B
Influenza A, POC: NEGATIVE
Influenza B, POC: NEGATIVE

## 2023-10-23 MED ORDER — LORATADINE 10 MG PO TABS: 10.0000 mg | ORAL_TABLET | Freq: Every day | ORAL | 1 refills | Status: AC

## 2023-10-23 NOTE — Addendum Note (Signed)
 Addended by: VICTORY IHA on: 10/23/2023 04:14 PM   Modules accepted: Orders

## 2023-10-23 NOTE — Patient Instructions (Addendum)
 Please take Benzonatate  200 mg 3 times daily as needed as a cough ,continue albuterol  inhaler 1 to 2 puffs as needed every 6 hours for wheezing Okay to take Tylenol  650 mg every 6 hours as needed for fever, Continue saline nasal spray as needed and take Claritin  10 mg daily for allergies   It is important that you exercise regularly at least 30 minutes 5 times a week as tolerated  Think about what you will eat, plan ahead. Choose  clean, green, fresh or frozen over canned, processed or packaged foods which are more sugary, salty and fatty. 70 to 75% of food eaten should be vegetables and fruit. Three meals at set times with snacks allowed between meals, but they must be fruit or vegetables. Aim to eat over a 12 hour period , example 7 am to 7 pm, and STOP after  your last meal of the day. Drink water,generally about 64 ounces per day, no other drink is as healthy. Fruit juice is best enjoyed in a healthy way, by EATING the fruit.  Thanks for choosing [facility] we consider it a privelige to serve you.

## 2023-10-23 NOTE — Assessment & Plan Note (Addendum)
  Assessment & Plan Acute upper respiratory infection with wheezing and suspected allergic rhinitis Likely viral etiology due to weather changes. Differential includes allergic rhinitis. No current indication for antibiotics unless symptoms worsen or persist with signs of bacterial infection. negative for COVID - Continue Tessalon  Pearl 200 mg three times daily as needed for cough. - Use albuterol  inhaler 1-2 pumps every 6 hours as needed for wheezing. - Use saline nasal spray for congestion. - Consider using Claritin  for suspected allergic rhinitis; send prescription for Claritin . - Maintain hydration and use hot tea, honey, and soups. - Take Tylenol  650 mg every 6 hours as needed for myalgia or fever. - Monitor symptoms and report if she worsens or persists by Friday for potential antibiotic treatment.

## 2023-10-23 NOTE — Progress Notes (Signed)
 Acute Office Visit  Subjective:     Patient ID: Laurie Buckley, female    DOB: Oct 11, 1965, 58 y.o.   MRN: [id number]  Chief Complaint  Patient presents with   Cough    Since last Friday    Cough Associated symptoms include rhinorrhea and wheezing. Pertinent negatives include no chest pain, chills, fever, headaches, myalgias, postnasal drip, rash or shortness of breath.   Patient is in today for    Discussed the use of AI scribe software for clinical note transcription with the patient, who gave verbal consent to proceed.  History of Present Illness Laurie Buckley is a 58 year old female who presents with respiratory symptoms including cough, congestion, and wheezing.  Her symptoms began last Thursday with thirst and rhinorrhea. 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 Since then, her symptoms have progressed to include cough, wheezing, and congestion.  She experiences nocturnal congestion, nasal obstruction, and sneezing. The nasal discharge varies between clear and light mucus. The symptoms have been progressively worsening since onset.  She took an at-home COVID test on Sunday, which was negative, and another test was conducted today, also negative. She has been using over-the-counter medications such as Nyquil for day and night relief, saline drops for nasal congestion, and a medication prescribed by [name], referred to as 'B E N' medicine, at a dose of 200 mg every three hours. She also uses an albuterol  inhaler, taking two puffs today for wheezing.  She maintains hydration by drinking hot tea with honey and consuming soups. She has not taken Claritin , although it was prescribed previously for allergy-like symptoms. She received a flu shot on September 4th.  She has been out of work due to her symptoms and wants to return to work on Thursday. She called out of work yesterday and is seeking  a note to return to work.  She does not currently have allergies, although she was previously prescribed allergy medication. She had an itchy throat at the beginning of her symptoms, which has since resolved.    Assessment & Plan    Review of Systems  Constitutional:  Negative for appetite change, chills, fatigue and fever.  HENT:  Positive for congestion, rhinorrhea and sneezing. Negative for postnasal drip.   Respiratory:  Positive for cough and wheezing. Negative for shortness of breath.   Cardiovascular:  Negative for chest pain, palpitations and leg swelling.  Gastrointestinal:  Negative for abdominal pain, constipation, nausea and vomiting.  Genitourinary:  Negative for difficulty urinating, dysuria, flank pain and frequency.  Musculoskeletal:  Negative for arthralgias, back pain, joint swelling and myalgias.  Skin:  Negative for color change, pallor, rash and wound.  Neurological:  Negative for dizziness, facial asymmetry, weakness, numbness and headaches.  Psychiatric/Behavioral:  Negative for behavioral problems, confusion, self-injury and suicidal ideas.         Objective:    BP 115/76   Pulse 89   Temp (!) 96.8 F (36 C)   Wt 275 lb (124.7 kg)   LMP 09/07/2023 (Approximate)   SpO2 96%   BMI 47.20 kg/m    Physical Exam Vitals and nursing note reviewed.  Constitutional:      General: She is not in acute distress.    Appearance: Normal appearance. She is obese. She is not ill-appearing, toxic-appearing or diaphoretic.  HENT:     Right Ear: Tympanic membrane, ear canal and external ear normal. There is  no impacted cerumen.     Left Ear: Tympanic membrane, ear canal and external ear normal. There is no impacted cerumen.     Nose: Congestion and rhinorrhea present.     Right Turbinates: Enlarged.     Left Turbinates: Enlarged.     Mouth/Throat:     Mouth: Mucous membranes are moist.     Pharynx: Oropharynx is clear. No oropharyngeal exudate or posterior  oropharyngeal erythema.     Tonsils: No tonsillar exudate or tonsillar abscesses. 0 on the right. 0 on the left.  Eyes:     General: No scleral icterus.       Right eye: No discharge.        Left eye: No discharge.     Extraocular Movements: Extraocular movements intact.     Conjunctiva/sclera: Conjunctivae normal.  Cardiovascular:     Rate and Rhythm: Normal rate and regular rhythm.     Pulses: Normal pulses.     Heart sounds: Normal heart sounds. No murmur heard.    No friction rub. No gallop.  Pulmonary:     Effort: Pulmonary effort is normal. No respiratory distress.     Breath sounds: Normal breath sounds. No stridor. No wheezing, rhonchi or rales.  Chest:     Chest wall: No tenderness.  Abdominal:     General: There is no distension.     Palpations: Abdomen is soft.     Tenderness: There is no abdominal tenderness. There is no right CVA tenderness, left CVA tenderness or guarding.  Musculoskeletal:        General: No swelling, tenderness, deformity or signs of injury.     Right lower leg: No edema.     Left lower leg: No edema.  Skin:    General: Skin is warm and dry.     Capillary Refill: Capillary refill takes less than 2 seconds.     Coloration: Skin is not jaundiced or pale.     Findings: No bruising, erythema or lesion.  Neurological:     Mental Status: She is alert and oriented to person, place, and time.     Motor: No weakness.     Coordination: Coordination normal.     Gait: Gait normal.  Psychiatric:        Mood and Affect: Mood normal.        Behavior: Behavior normal.        Thought Content: Thought content normal.        Judgment: Judgment normal.     No results found for any visits on 10/23/23.      Assessment & Plan:   Problem List Items Addressed This Visit       Other   Acute cough - Primary    Assessment & Plan Acute upper respiratory infection with wheezing and suspected allergic rhinitis Likely viral etiology due to weather changes.  Differential includes allergic rhinitis. No current indication for antibiotics unless symptoms worsen or persist with signs of bacterial infection. negative for COVID - Continue Tessalon  Pearl 200 mg three times daily as needed for cough. - Use albuterol  inhaler 1-2 pumps every 6 hours as needed for wheezing. - Use saline nasal spray for congestion. - Consider using Claritin  for suspected allergic rhinitis; send prescription for Claritin . - Maintain hydration and use hot tea, honey, and soups. - Take Tylenol  650 mg every 6 hours as needed for myalgia or fever. - Monitor symptoms and report if she worsens or persists by Friday for potential antibiotic treatment.  Relevant Medications   loratadine  (CLARITIN ) 10 MG tablet    Meds ordered this encounter  Medications   loratadine  (CLARITIN ) 10 MG tablet    Sig: Take 1 tablet (10 mg total) by mouth daily.    Dispense:  30 tablet    Refill:  1    No follow-ups on file.  Andie Mungin R Kyren Vaux, FNP

## 2023-10-24 ENCOUNTER — Other Ambulatory Visit (INDEPENDENT_AMBULATORY_CARE_PROVIDER_SITE_OTHER): Payer: Self-pay | Admitting: Primary Care

## 2023-10-24 DIAGNOSIS — Z760 Encounter for issue of repeat prescription: Secondary | ICD-10-CM

## 2023-10-24 DIAGNOSIS — I10 Essential (primary) hypertension: Secondary | ICD-10-CM

## 2023-10-24 DIAGNOSIS — R7303 Prediabetes: Secondary | ICD-10-CM

## 2023-10-24 MED ORDER — LOSARTAN POTASSIUM-HCTZ 100-25 MG PO TABS: 1.0000 | ORAL_TABLET | Freq: Every day | ORAL | 1 refills | Status: DC

## 2023-10-24 MED ORDER — AMLODIPINE BESYLATE 10 MG PO TABS: 10.0000 mg | ORAL_TABLET | Freq: Every day | ORAL | 1 refills | Status: DC

## 2023-10-24 MED ORDER — METFORMIN HCL 1000 MG PO TABS: 1000.0000 mg | ORAL_TABLET | Freq: Two times a day (BID) | ORAL | 1 refills | Status: DC

## 2023-12-07 ENCOUNTER — Telehealth (INDEPENDENT_AMBULATORY_CARE_PROVIDER_SITE_OTHER): Payer: Self-pay | Admitting: Primary Care

## 2023-12-07 NOTE — Telephone Encounter (Signed)
 Will forward to provider

## 2023-12-07 NOTE — Telephone Encounter (Signed)
 Copied from CRM #[id number]. Topic: Referral - Status >> Dec 07, 2023  3:15 PM Delon HERO wrote: Reason for CRM: Patient is calling to request referral to be sent to [facility] [address] [place], [place], [place] [address] [phone]  Appointment is 12/11/2023 at 2;30p

## 2023-12-14 ENCOUNTER — Other Ambulatory Visit (INDEPENDENT_AMBULATORY_CARE_PROVIDER_SITE_OTHER): Payer: Self-pay | Admitting: Primary Care

## 2023-12-14 DIAGNOSIS — N939 Abnormal uterine and vaginal bleeding, unspecified: Secondary | ICD-10-CM

## 2023-12-31 ENCOUNTER — Encounter: Admitting: Obstetrics and Gynecology

## 2024-01-11 ENCOUNTER — Encounter (HOSPITAL_COMMUNITY): Payer: Self-pay | Admitting: Obstetrics and Gynecology

## 2024-01-15 ENCOUNTER — Encounter (HOSPITAL_COMMUNITY): Payer: Self-pay | Admitting: Obstetrics and Gynecology

## 2024-01-15 ENCOUNTER — Encounter (HOSPITAL_COMMUNITY): Payer: Self-pay

## 2024-01-15 NOTE — Progress Notes (Signed)
 Spoke w/ via phone for pre-op interview--- Laurie Buckley needs dos---- BMP, A1C, CBG and EKG per anesthesia. Surgeon orders requested 01/14/24.        Buckley results------ COVID test -----patient states asymptomatic no test needed Arrive at -------0530 NPO after MN NO Solid Food.   Pre-Surgery Ensure or G2:  Med rec completed Medications to take morning of surgery -----Norvasc  Diabetic medication -----None AM of surgery.  GLP1 agonist last dose: GLP1 instructions:  Patient instructed no nail polish to be worn day of surgery Patient instructed to bring photo id and insurance card day of surgery Patient aware to have Driver (ride ) / caregiver    for 24 hours after surgery - Daughter Laurie Buckley Patient Special Instructions ----- Pre-Op special Instructions -----  Patient verbalized understanding of instructions that were given at this phone interview. Patient denies chest pain, sob, fever, cough at the interview.

## 2024-01-17 NOTE — Anesthesia Preprocedure Evaluation (Signed)
 Anesthesia Evaluation  Patient identified by MRN, date of birth, ID band Patient awake    Reviewed: Allergy & Precautions, NPO status , Patient's Chart, lab work & pertinent test results  Airway Mallampati: II  TM Distance: >3 FB Neck ROM: Full    Dental  (+) Teeth Intact, Dental Advisory Given   Pulmonary asthma , sleep apnea    Pulmonary exam normal breath sounds clear to auscultation       Cardiovascular hypertension, Pt. on medications Normal cardiovascular exam Rhythm:Regular Rate:Normal     Neuro/Psych negative neurological ROS     GI/Hepatic Neg liver ROS,GERD  ,,  Endo/Other  diabetes, Type 2, Oral Hypoglycemic Agents    Renal/GU negative Renal ROS     Musculoskeletal negative musculoskeletal ROS (+)    Abdominal   Peds  Hematology negative hematology ROS (+)   Anesthesia Other Findings   Reproductive/Obstetrics postemnopausal bleeding  endometrial polyp                                Anesthesia Physical Anesthesia Plan  ASA: 2  Anesthesia Plan: General   Post-op Pain Management: Tylenol  PO (pre-op)* and Toradol IV (intra-op)*   Induction: Intravenous  PONV Risk Score and Plan: 4 or greater and Scopolamine patch - Pre-op, Midazolam, Dexamethasone  and Ondansetron  Airway Management Planned: LMA  Additional Equipment:   Intra-op Plan:   Post-operative Plan: Extubation in OR  Informed Consent: I have reviewed the patients History and Physical, chart, labs and discussed the procedure including the risks, benefits and alternatives for the proposed anesthesia with the patient or authorized representative who has indicated his/her understanding and acceptance.     Dental advisory given  Plan Discussed with: CRNA  Anesthesia Plan Comments:          Anesthesia Quick Evaluation

## 2024-01-18 ENCOUNTER — Other Ambulatory Visit: Payer: Self-pay

## 2024-01-18 ENCOUNTER — Encounter (HOSPITAL_COMMUNITY): Payer: Self-pay | Admitting: Obstetrics and Gynecology

## 2024-01-18 ENCOUNTER — Ambulatory Visit (HOSPITAL_COMMUNITY)

## 2024-01-18 ENCOUNTER — Encounter (HOSPITAL_COMMUNITY): Admission: RE | Disposition: A | Payer: Self-pay | Source: Home / Self Care | Attending: Obstetrics and Gynecology

## 2024-01-18 ENCOUNTER — Ambulatory Visit (HOSPITAL_COMMUNITY)
Admission: RE | Admit: 2024-01-18 | Discharge: 2024-01-18 | Disposition: A | Discharge status: Home / Self Care | Attending: Obstetrics and Gynecology | Admitting: Obstetrics and Gynecology

## 2024-01-18 DIAGNOSIS — E119 Type 2 diabetes mellitus without complications: Secondary | ICD-10-CM | POA: Diagnosis not present

## 2024-01-18 DIAGNOSIS — I10 Essential (primary) hypertension: Secondary | ICD-10-CM | POA: Diagnosis not present

## 2024-01-18 DIAGNOSIS — Z01818 Encounter for other preprocedural examination: Secondary | ICD-10-CM

## 2024-01-18 DIAGNOSIS — J45909 Unspecified asthma, uncomplicated: Secondary | ICD-10-CM | POA: Diagnosis not present

## 2024-01-18 DIAGNOSIS — C541 Malignant neoplasm of endometrium: Secondary | ICD-10-CM | POA: Diagnosis not present

## 2024-01-18 DIAGNOSIS — G4730 Sleep apnea, unspecified: Secondary | ICD-10-CM | POA: Diagnosis not present

## 2024-01-18 DIAGNOSIS — N840 Polyp of corpus uteri: Secondary | ICD-10-CM

## 2024-01-18 DIAGNOSIS — K219 Gastro-esophageal reflux disease without esophagitis: Secondary | ICD-10-CM | POA: Diagnosis not present

## 2024-01-18 DIAGNOSIS — Z7984 Long term (current) use of oral hypoglycemic drugs: Secondary | ICD-10-CM | POA: Diagnosis not present

## 2024-01-18 DIAGNOSIS — N950 Postmenopausal bleeding: Secondary | ICD-10-CM | POA: Diagnosis present

## 2024-01-18 HISTORY — DX: Type 2 diabetes mellitus without complications: E11.9

## 2024-01-18 HISTORY — PX: MYOSURE RESECTION: SHX7611

## 2024-01-18 HISTORY — PX: DILATATION & CURRETTAGE/HYSTEROSCOPY WITH RESECTOCOPE: SHX5572

## 2024-01-18 LAB — CBC
HCT: 40.9 % (ref 36.0–46.0)
Hemoglobin: 13.3 g/dL (ref 12.0–15.0)
MCH: 29.8 pg (ref 26.0–34.0)
MCHC: 32.5 g/dL (ref 30.0–36.0)
MCV: 91.7 fL (ref 80.0–100.0)
Platelets: 262 K/uL (ref 150–400)
RBC: 4.46 MIL/uL (ref 3.87–5.11)
RDW: 12.7 % (ref 11.5–15.5)
WBC: 5.2 K/uL (ref 4.0–10.5)
nRBC: 0 % (ref 0.0–0.2)

## 2024-01-18 LAB — BASIC METABOLIC PANEL WITH GFR
Anion gap: 10 (ref 5–15)
BUN: 17 mg/dL (ref 6–20)
CO2: 25 mmol/L (ref 22–32)
Calcium: 8.7 mg/dL — ABNORMAL LOW (ref 8.9–10.3)
Chloride: 105 mmol/L (ref 98–111)
Creatinine, Ser: 0.91 mg/dL (ref 0.44–1.00)
GFR, Estimated: >60 mL/min (ref >60)
Glucose, Bld: 140 mg/dL — ABNORMAL HIGH (ref 70–99)
Potassium: 3.6 mmol/L (ref 3.5–5.1)
Sodium: 140 mmol/L (ref 135–145)

## 2024-01-18 LAB — TYPE AND SCREEN
ABO/RH(D): O POS
Antibody Screen: NEGATIVE

## 2024-01-18 LAB — HEMOGLOBIN A1C
Hgb A1c MFr Bld: 7 % — ABNORMAL HIGH (ref 4.8–5.6)
Mean Plasma Glucose: 154.2 mg/dL

## 2024-01-18 LAB — GLUCOSE, CAPILLARY
Glucose-Capillary: 124 mg/dL — ABNORMAL HIGH (ref 70–99)
Glucose-Capillary: 122 mg/dL — ABNORMAL HIGH (ref 70–99)

## 2024-01-18 LAB — ABO/RH: ABO/RH(D): O POS

## 2024-01-18 SURGERY — DILATATION & CURETTAGE/HYSTEROSCOPY WITH RESECTOCOPE
Anesthesia: General | Site: Uterus

## 2024-01-18 MED ORDER — ORAL CARE MOUTH RINSE: 15.0000 mL | Freq: Once | OROMUCOSAL | Status: AC

## 2024-01-18 MED ORDER — PROPOFOL 10 MG/ML IV BOLUS
INTRAVENOUS | Status: DC | PRN
  Administered 2024-01-18: 170 mg via INTRAVENOUS

## 2024-01-18 MED ORDER — MIDAZOLAM HCL 2 MG/2ML IJ SOLN
INTRAMUSCULAR | Status: AC
  Filled 2024-01-18: qty 2

## 2024-01-18 MED ORDER — MIDAZOLAM HCL (PF) 2 MG/2ML IJ SOLN
INTRAMUSCULAR | Status: DC | PRN
  Administered 2024-01-18: 2 mg via INTRAVENOUS

## 2024-01-18 MED ORDER — KETOROLAC TROMETHAMINE 30 MG/ML IJ SOLN
INTRAMUSCULAR | Status: AC
  Filled 2024-01-18: qty 1

## 2024-01-18 MED ORDER — ACETAMINOPHEN 500 MG PO TABS
ORAL_TABLET | ORAL | Status: AC
  Filled 2024-01-18: qty 2

## 2024-01-18 MED ORDER — ACETAMINOPHEN 500 MG PO TABS
1000.0000 mg | ORAL_TABLET | Freq: Once | ORAL | Status: AC
  Administered 2024-01-18: 1000 mg via ORAL

## 2024-01-18 MED ORDER — SOD CITRATE-CITRIC ACID 500-334 MG/5ML PO SOLN: 30.0000 mL | ORAL | Status: DC

## 2024-01-18 MED ORDER — LACTATED RINGERS IV SOLN: INTRAVENOUS | Status: DC

## 2024-01-18 MED ORDER — PHENYLEPHRINE 80 MCG/ML (10ML) SYRINGE FOR IV PUSH (FOR BLOOD PRESSURE SUPPORT)
PREFILLED_SYRINGE | INTRAVENOUS | Status: DC | PRN
  Administered 2024-01-18: 80 ug via INTRAVENOUS

## 2024-01-18 MED ORDER — FENTANYL CITRATE (PF) 100 MCG/2ML IJ SOLN
INTRAMUSCULAR | Status: AC
  Filled 2024-01-18: qty 2

## 2024-01-18 MED ORDER — AMISULPRIDE (ANTIEMETIC) 5 MG/2ML IV SOLN: 10.0000 mg | Freq: Once | INTRAVENOUS | Status: DC | PRN

## 2024-01-18 MED ORDER — IBUPROFEN 800 MG PO TABS: 800.0000 mg | ORAL_TABLET | Freq: Three times a day (TID) | ORAL | 0 refills | Status: AC | PRN

## 2024-01-18 MED ORDER — KETOROLAC TROMETHAMINE 30 MG/ML IJ SOLN
30.0000 mg | INTRAMUSCULAR | Status: AC
  Administered 2024-01-18: 30 mg via INTRAVENOUS

## 2024-01-18 MED ORDER — DEXAMETHASONE SOD PHOSPHATE PF 10 MG/ML IJ SOLN
INTRAMUSCULAR | Status: DC | PRN
  Administered 2024-01-18: 5 mg via INTRAVENOUS

## 2024-01-18 MED ORDER — SODIUM CHLORIDE 0.9 % IR SOLN
Status: DC | PRN
  Administered 2024-01-18: 3000 mL

## 2024-01-18 MED ORDER — POVIDONE-IODINE 10 % EX SWAB: 2.0000 | Freq: Once | CUTANEOUS | Status: DC

## 2024-01-18 MED ORDER — CHLORHEXIDINE GLUCONATE 0.12 % MT SOLN
15.0000 mL | Freq: Once | OROMUCOSAL | Status: AC
  Administered 2024-01-18: 15 mL via OROMUCOSAL

## 2024-01-18 MED ORDER — LIDOCAINE HCL (CARDIAC) PF 100 MG/5ML IV SOSY
PREFILLED_SYRINGE | INTRAVENOUS | Status: DC | PRN
  Administered 2024-01-18: 100 mg via INTRAVENOUS

## 2024-01-18 MED ORDER — PROPOFOL 10 MG/ML IV BOLUS
INTRAVENOUS | Status: AC
  Filled 2024-01-18: qty 20

## 2024-01-18 MED ORDER — CHLORHEXIDINE GLUCONATE 0.12 % MT SOLN
OROMUCOSAL | Status: AC
  Filled 2024-01-18: qty 15

## 2024-01-18 MED ORDER — INSULIN ASPART 100 UNIT/ML IJ SOLN: 0.0000 [IU] | INTRAMUSCULAR | Status: DC | PRN

## 2024-01-18 MED ORDER — FENTANYL CITRATE (PF) 250 MCG/5ML IJ SOLN
INTRAMUSCULAR | Status: DC | PRN
  Administered 2024-01-18: 50 ug via INTRAVENOUS

## 2024-01-18 MED ORDER — ONDANSETRON HCL 4 MG/2ML IJ SOLN: 4.0000 mg | Freq: Once | INTRAMUSCULAR | Status: DC | PRN

## 2024-01-18 MED ORDER — FENTANYL CITRATE (PF) 100 MCG/2ML IJ SOLN: 25.0000 ug | INTRAMUSCULAR | Status: DC | PRN

## 2024-01-18 MED ORDER — ONDANSETRON HCL 4 MG/2ML IJ SOLN
INTRAMUSCULAR | Status: DC | PRN
  Administered 2024-01-18: 4 mg via INTRAVENOUS

## 2024-01-18 SURGICAL SUPPLY — 17 items
CATH ROBINSON RED A/P 16FR (CATHETERS) ×2 IMPLANT
DEVICE MYOSURE LITE (MISCELLANEOUS) IMPLANT
DEVICE MYOSURE REACH (MISCELLANEOUS) IMPLANT
DILATOR CANAL MILEX (MISCELLANEOUS) IMPLANT
GLOVE BIOGEL PI IND STRL 7.0 (GLOVE) IMPLANT
GLOVE BIOGEL PI MICRO STRL 6.5 (GLOVE) ×2 IMPLANT
GLOVE SURG UNDER POLY LF SZ7 (GLOVE) ×2 IMPLANT
GOWN STRL REUS W/ TWL LRG LVL3 (GOWN DISPOSABLE) ×2 IMPLANT
KIT PROCED FLUENT PRO FLT212S (KITS) ×2 IMPLANT
KIT TURNOVER KIT B (KITS) ×2 IMPLANT
PACK VAGINAL MINOR WOMEN LF (CUSTOM PROCEDURE TRAY) ×2 IMPLANT
SEAL CERVICAL OMNI LOK (ABLATOR) IMPLANT
SEAL ROD LENS SCOPE MYOSURE (ABLATOR) ×2 IMPLANT
SOL .9 NS 3000ML IRR UROMATIC (IV SOLUTION) IMPLANT
SYSTEM TISS REMOVAL MYOSURE XL (MISCELLANEOUS) IMPLANT
TOWEL GREEN STERILE FF (TOWEL DISPOSABLE) ×2 IMPLANT
UNDERPAD 30X36 HEAVY ABSORB (UNDERPADS AND DIAPERS) ×2 IMPLANT

## 2024-01-18 NOTE — Anesthesia Procedure Notes (Signed)
 Procedure Name: LMA Insertion Date/Time: 01/18/2024 7:42 AM  Performed by: Elaynah Virginia, CRNAPre-anesthesia Checklist: Patient identified, Emergency Drugs available, Suction available and Patient being monitored Patient Re-evaluated:Patient Re-evaluated prior to induction Oxygen Delivery Method: Circle System Utilized Preoxygenation: Pre-oxygenation with 100% oxygen Induction Type: IV induction Ventilation: Mask ventilation without difficulty LMA: LMA inserted LMA Size: 4.0 Number of attempts: 1 Airway Equipment and Method: Bite block Placement Confirmation: positive ETCO2 Tube secured with: Tape Dental Injury: Teeth and Oropharynx as per pre-operative assessment  Comments: Easy atraumatic LMA placement. Adequate tidal volumes. Teeth, lips, and tongue unchanged. Head and neck midline.

## 2024-01-18 NOTE — Transfer of Care (Signed)
 Immediate Anesthesia Transfer of Care Note  Patient: Laurie Buckley  Procedure(s) Performed: DILATATION & CURETTAGE/HYSTEROSCOPIC RESECTION OF ENDOMETRIAL POLYPS (Uterus) MYOSURE RESECTION (Uterus) HYSTEROSCOPY, DIAGNOSTIC (Uterus)  Patient Location: PACU  Anesthesia Type:General  Level of Consciousness: awake, alert , and oriented  Airway & Oxygen Therapy: Patient Spontanous Breathing and Patient connected to face mask oxygen  Post-op Assessment: Report given to RN and Post -op Vital signs reviewed and stable  Post vital signs: Reviewed and stable  Last Vitals:  Vitals Value Taken Time  BP 122/87 01/18/24 08:10  Temp 36.6 C 01/18/24 08:10  Pulse 85 01/18/24 08:11  Resp 21 01/18/24 08:11  SpO2 96 % 01/18/24 08:11  Vitals shown include unfiled device data.  Last Pain:  Vitals:   01/18/24 0635  TempSrc:   PainSc: 4       Patients Stated Pain Goal: 4 (01/18/24 9364)  Complications: No notable events documented.

## 2024-01-18 NOTE — Interval H&P Note (Signed)
 History and Physical Interval Note:  01/18/2024 7:06 AM  Laurie Buckley  has presented today for surgery, with the diagnosis of postemnopausal bleeding endometrial polyp.  The various methods of treatment have been discussed with the patient and family. After consideration of risks, benefits and other options for treatment, the patient has consented to  Procedures: diagnostic hysteroscopy, hysteroscopic resection of endometrial polyp using myosure, dilation and curettage  s a surgical intervention.  The patient's history has been reviewed, patient examined, no change in status, stable for surgery.  I have reviewed the patient's chart and labs.  Questions were answered to the patient's satisfaction.     Milessa Hogan A Paco Cislo

## 2024-01-18 NOTE — H&P (Signed)
 Laurie Buckley is an 58 y.o. female. [id number] SF presents for surgical management  of PMB. Sonogram evaluation revealed endometrial polyps, thickened endometrium and multiple uterine fibroids. Pt is scheduled for dx hysteroscopy, hysteroscopic resection of endometrial polyp using myosure, dilation and curettage  Pertinent Gynecological History: Menses: post-menopausal Bleeding: post menopausal bleeding Contraception: post menopausal status and tubal ligation DES exposure: denies Blood transfusions: none Sexually transmitted diseases: no past history Previous GYN Procedures: tl. D&C  Last mammogram: normal Date: unsure Last pap: normal Date: 2025 OB History: G2, P0020   Menstrual History: Menarche age: n/a No LMP recorded. (Menstrual status: Perimenopausal).    Past Medical History:  Diagnosis Date   Diabetes mellitus without complication (HCC)    GERD (gastroesophageal reflux disease)    with certain foods   Hypertension    on meds   Sleep apnea    uses CPAP    Past Surgical History:  Procedure Laterality Date   TUBAL LIGATION  1996   WISDOM TOOTH EXTRACTION      Family History  Problem Relation Age of Onset   Colon polyps Brother 10   Colon cancer Maternal Uncle    Esophageal cancer Neg Hx    Rectal cancer Neg Hx    Stomach cancer Neg Hx     Social History:  reports that she has never smoked. She has never used smokeless tobacco. She reports that she does not drink alcohol and does not use drugs.  Allergies: Allergies[1]  No medications prior to admission.    Review of Systems  Genitourinary:  Positive for vaginal bleeding.  All other systems reviewed and are negative.   Weight 123.4 kg. Physical Exam Constitutional:      Appearance: Normal appearance. She is obese.  HENT:     Head: Atraumatic.  Eyes:     Extraocular Movements: Extraocular movements intact.  Cardiovascular:     Rate and Rhythm: Regular rhythm.     Heart sounds: Normal heart sounds.   Pulmonary:     Breath sounds: Normal breath sounds.  Abdominal:     Palpations: Abdomen is soft.     Comments: obese  Genitourinary:    General: Normal vulva.     Comments: Vagina: atrophic pale mucosa Cervix  nulliparous Uterus AV limited by body habitus Adnexa nonpalpable Musculoskeletal:        General: Normal range of motion.     Cervical back: Neck supple.  Skin:    General: Skin is dry.  Neurological:     General: No focal deficit present.     Mental Status: She is alert and oriented to person, place, and time.  Psychiatric:        Mood and Affect: Mood normal.        Behavior: Behavior normal.     No results found for this or any previous visit (from the past 24 hours).  No results found.  Assessment/Plan: PMB Endometrial polyp.  Ednometrial thickening on sonogram Morbid obesity HTN DM P) dx hysteroscopy, hysteroscopic resection of endometrial polyp using myosure, D&C. Procedure explained. Risk of surgery reviewed including infection, bleeding, injury to surrounding organ structures, themal injury, fluid overload and its mgmt. Uterine perforation ( 02/998) and its risk. All ? answered  Laurie Buckley A Gaby Harney 01/18/2024, 4:06 AM     [1] No Known Allergies

## 2024-01-18 NOTE — Discharge Instructions (Signed)
CALL  IF TEMP>100.4, NOTHING PER VAGINA X 1 WK, CALL IF SOAKING A MAXI  PAD EVERY HOUR OR MORE FREQUENTLY 

## 2024-01-18 NOTE — Anesthesia Postprocedure Evaluation (Signed)
 Anesthesia Post Note  Patient: Laurie Buckley  Procedure(s) Performed: DILATATION & CURETTAGE/HYSTEROSCOPIC RESECTION OF ENDOMETRIAL POLYPS (Uterus) MYOSURE RESECTION (Uterus) HYSTEROSCOPY, DIAGNOSTIC (Uterus)     Patient location during evaluation: PACU Anesthesia Type: General Level of consciousness: awake and alert Pain management: pain level controlled Vital Signs Assessment: post-procedure vital signs reviewed and stable Respiratory status: spontaneous breathing, nonlabored ventilation and respiratory function stable Cardiovascular status: blood pressure returned to baseline and stable Postop Assessment: no apparent nausea or vomiting Anesthetic complications: no   No notable events documented.  Last Vitals:  Vitals:   01/18/24 0815 01/18/24 0830  BP: 118/83 132/87  Pulse: 80 76  Resp: 15 15  Temp:  36.6 C  SpO2: 97% 100%    Last Pain:  Vitals:   01/18/24 0830  TempSrc:   PainSc: 0-No pain                 Garnette FORBES Skillern

## 2024-01-18 NOTE — Op Note (Signed)
 NAME: Laurie Buckley, Laurie A. MEDICAL RECORD NO: [id number] ACCOUNT NO: [id number] DATE OF BIRTH: 30-Sep-1965 FACILITY: [facility] LOCATION: [facility] PHYSICIAN: Mignon Bechler A. Rutherford, MD  Operative Report   DATE OF PROCEDURE: 01/18/2024  PREOPERATIVE DIAGNOSES:   1.  Postmenopausal bleeding. 2.  Endometrial polyp.  PROCEDURE:  Diagnostic hysteroscopy, hysteroscopic resection of endometrial polyp using MyoSure, dilation and curettage.  POSTOPERATIVE DIAGNOSES:   1.  Postmenopausal bleeding. 2.  Endometrial polyp. 3.  Endometrial thickening.  ANESTHESIA:  General.  SURGEON:  Dickie Rutherford, MD  ASSISTANT:  None.  DESCRIPTION OF PROCEDURE:  Under adequate general anesthesia, the patient was placed in the dorsal lithotomy position.  She was sterilely prepped and draped in the usual fashion.  The bladder was catheterized, small amount of urine.  Examination under  anesthesia revealed an anteverted uterus.  No adnexal masses could be appreciated.  A bivalve speculum was placed in the vagina.  A single-tooth tenaculum was placed on the anterior lip of the cervix.  The cervix was parous and easily accepted the 17  Pratt dilator.  A diagnostic hysteroscope was introduced into the uterine cavity.  Diffuse polypoid lesions were noted that obscured the views of both tubal ostia.  Using the REACH resectoscope, the entire endometrial cavity and polypoid lesions were  resected.  That resulted in the left tubal ostium being seen.  The right was still not seen.  When all tissue was felt to have been removed, the endocervical canal was then inspected.  Nabothian-like cysts were noted which were not removed.  All  instruments were then removed from the vagina.  SPECIMEN:  Labeled endometrial curetting with endometrial polyp was sent to pathology.  ESTIMATED BLOOD LOSS:  2 mL.  FLUID DEFICIT:  110 mL.  COMPLICATIONS:  None.  CONDITION:  The patient tolerated the procedure well and was transferred to the  recovery room in stable condition.    SHW D: 01/18/2024 8:15:18 am T: 01/18/2024 8:49:00 am  JOB: [id number]/ [id number]

## 2024-01-18 NOTE — Brief Op Note (Signed)
 01/18/2024  8:16 AM  PATIENT:  Laurie Buckley  58 y.o. female  PRE-OPERATIVE DIAGNOSIS:  postemnopausal bleeding endometrial polyp, endometrial thickening on sonogram  POST-OPERATIVE DIAGNOSIS:  postemnopausal bleeding, endometrial polyp, endometrial thickening  PROCEDURE:  Procedures: DILATATION & CURETTAGE/HYSTEROSCOPIC RESECTION OF ENDOMETRIAL POLYPS (N/A) MYOSURE RESECTION HYSTEROSCOPY, DIAGNOSTIC  SURGEON:  Surgeons and Role:    * Rutherford Gain, MD - Primary  PHYSICIAN ASSISTANT:   ASSISTANTS: none   ANESTHESIA:   general Finding: left tubal ostia seen. Diffuse endometrial thickening with endometrial polyps EBL:  2 mL   BLOOD ADMINISTERED:none  DRAINS: none   LOCAL MEDICATIONS USED:  NONE  SPECIMEN:  Source of Specimen:  EMC with polyps  DISPOSITION OF SPECIMEN:  PATHOLOGY  COUNTS:  YES  TOURNIQUET:  * No tourniquets in log *  DICTATION: .Other Dictation: Dictation Number [id number]  PLAN OF CARE: Discharge to home after PACU  PATIENT DISPOSITION:  PACU - hemodynamically stable.   Delay start of Pharmacological VTE agent (>24hrs) due to surgical blood loss or risk of bleeding: no

## 2024-01-19 ENCOUNTER — Encounter (HOSPITAL_COMMUNITY): Payer: Self-pay | Admitting: Obstetrics and Gynecology

## 2024-01-22 ENCOUNTER — Ambulatory Visit

## 2024-01-22 ENCOUNTER — Ambulatory Visit: Payer: Self-pay

## 2024-01-22 VITALS — BP 132/83 | HR 95 | Temp 98.5°F | Resp 16 | Ht 65.0 in | Wt 271.0 lb

## 2024-01-22 DIAGNOSIS — R051 Acute cough: Secondary | ICD-10-CM

## 2024-01-22 LAB — POCT RAPID STREP A (OFFICE): Rapid Strep A Screen: NEGATIVE

## 2024-01-22 LAB — POC SOFIA 2 FLU + SARS ANTIGEN FIA
Influenza A, POC: NEGATIVE
Influenza B, POC: NEGATIVE
SARS Coronavirus 2 Ag: NEGATIVE

## 2024-01-22 LAB — SURGICAL PATHOLOGY

## 2024-01-22 MED ORDER — BENZONATATE 200 MG PO CAPS: 200.0000 mg | ORAL_CAPSULE | Freq: Two times a day (BID) | ORAL | 0 refills | Status: AC | PRN

## 2024-01-22 NOTE — Telephone Encounter (Signed)
 FYI Only or Action Required?: FYI only for provider: appointment scheduled on 01/22/2024 at 4 PM.  Patient was last seen in primary care on 10/23/2023 by Paseda, Folashade R, FNP.  Called Nurse Triage reporting Cough.  Symptoms began this past Sunday.  Interventions attempted: Rest, hydration, or home remedies.  Symptoms are: unchanged.  Triage Disposition: See Physician Within 24 Hours  Patient/caregiver understands and will follow disposition?: Yes  Message from Tyro S sent at 01/22/2024 11:08 AM EST  Summary: Cough, congestion, yellow mucous   Reason for Triage: The patient called in stating she just got back from a cruise and is complaining of congestion, cough and producing yellow mucous. She also had a headache yesterday. She did say that the Benzonatate  does help her but she is out of that. Please assist patient further         Reason for Disposition  [1] Continuous (nonstop) coughing interferes with work or school AND [2] no improvement using cough treatment per Care Advice  Answer Assessment - Initial Assessment Questions Patient endorses developed cough with yellow sputum and congestion on Sunday. Patient did return home from a cruise on 01/14/2024. Patient reports generally not feeling well. Scheduled for an acute visit today at 4 PM at another location.   1. ONSET: When did the cough begin?      Started on Sunday 2. SEVERITY: How bad is the cough today?      mild 3. SPUTUM: Describe the color of your sputum (e.g., none, dry cough; clear, white, yellow, green)     yellow 4. HEMOPTYSIS: Are you coughing up any blood? If Yes, ask: How much? (e.g., flecks, streaks, tablespoons, etc.)     no 5. DIFFICULTY BREATHING: Are you having difficulty breathing? If Yes, ask: How bad is it? (e.g., mild, moderate, severe)      no 6. FEVER: Do you have a fever? If Yes, ask: What is your temperature, how was it measured, and when did it start?     no 7. CARDIAC  HISTORY: Do you have any history of heart disease? (e.g., heart attack, congestive heart failure)      no 8. LUNG HISTORY: Do you have any history of lung disease?  (e.g., pulmonary embolus, asthma, emphysema)     no 9. PE RISK FACTORS: Do you have a history of blood clots? (or: recent major surgery, recent prolonged travel, bedridden)     no 10. OTHER SYMPTOMS: Do you have any other symptoms? (e.g., runny nose, wheezing, chest pain)       Congestion, headache 12. TRAVEL: Have you traveled out of the country in the last month? (e.g., travel history, exposures)       Went on a cruise  Protocols used: Cough - Acute Productive-A-AH

## 2024-01-22 NOTE — Progress Notes (Unsigned)
° ° ° °  Patient ID: Laurie Buckley, female    DOB: Mar 14, 1965  MRN: [id number]  CC: Cough (Yellow mucus, coughing, headache, head congestion)   Subjective: Laurie Buckley is a 59 y.o. female with past medical history of *** who presents to clinic for  3 day history cough, runny nose, headache. No fever, (2 yr old grandson, cough, runny nose) no sore throat, no difficulty breathing.  Allergies[1]  ROS: Review of Systems Negative except as stated above  PHYSICAL EXAM: BP 132/83 (BP Location: Left Arm)   Pulse 95   Temp 98.5 F (36.9 C)   Resp 16   Ht 5' 5 (1.651 m)   Wt 271 lb (122.9 kg)   LMP 09/07/2023   SpO2 92%   BMI 45.10 kg/m   Physical Exam  General: well-appearing, no acute distress Skin: no jaundice, rashes, or lesions Cardiovascular: regular heart rate and rhythm, normal S1/S2, no murmurs, gallops, or rubs, peripheral pulses 2+ bilaterally Chest: no skeletal deformity, lungs clear to auscultation bilaterally, equal breath sounds bilaterally Abdomen: soft, non-distended, non-tender to palpation, no hepatomegaly, no splenomegaly, normoactive bowel sounds Musculoskeletal: normal gait Extremities: no peripheral edema  ASSESSMENT AND PLAN:  1. Acute cough (Primary) *** - benzonatate  (TESSALON ) 200 MG capsule; Take 1 capsule (200 mg total) by mouth 2 (two) times daily as needed for cough.  Dispense: 30 capsule; Refill: 0 - POCT rapid strep A - POC SOFIA 2 FLU + SARS ANTIGEN FIA    Patient was given the opportunity to ask questions.  Patient verbalized understanding of the plan and was able to repeat key elements of the plan.    No orders of the defined types were placed in this encounter.    Requested Prescriptions   Pending Prescriptions Disp Refills   benzonatate  (TESSALON ) 200 MG capsule 20 capsule 0    Sig: Take 1 capsule (200 mg total) by mouth 2 (two) times daily as needed for cough.    No follow-ups on file.  Sula Cower Skyler Dusing, PA-C     [1]  No Known Allergies

## 2024-02-15 ENCOUNTER — Encounter (INDEPENDENT_AMBULATORY_CARE_PROVIDER_SITE_OTHER): Payer: Self-pay | Admitting: Primary Care

## 2024-02-18 ENCOUNTER — Encounter (INDEPENDENT_AMBULATORY_CARE_PROVIDER_SITE_OTHER): Payer: Self-pay | Admitting: Primary Care

## 2024-02-18 ENCOUNTER — Telehealth (INDEPENDENT_AMBULATORY_CARE_PROVIDER_SITE_OTHER): Payer: Self-pay | Admitting: Primary Care

## 2024-02-18 DIAGNOSIS — Z760 Encounter for issue of repeat prescription: Secondary | ICD-10-CM

## 2024-02-18 DIAGNOSIS — R059 Cough, unspecified: Secondary | ICD-10-CM

## 2024-02-18 DIAGNOSIS — R7303 Prediabetes: Secondary | ICD-10-CM

## 2024-02-18 DIAGNOSIS — I10 Essential (primary) hypertension: Secondary | ICD-10-CM | POA: Diagnosis not present

## 2024-02-18 MED ORDER — ALBUTEROL SULFATE HFA 108 (90 BASE) MCG/ACT IN AERS: 1.0000 | INHALATION_SPRAY | Freq: Four times a day (QID) | RESPIRATORY_TRACT | 0 refills | Status: AC | PRN

## 2024-02-18 MED ORDER — METFORMIN HCL 1000 MG PO TABS: 1000.0000 mg | ORAL_TABLET | Freq: Two times a day (BID) | ORAL | 1 refills | Status: AC

## 2024-02-18 MED ORDER — AMLODIPINE BESYLATE 10 MG PO TABS: 10.0000 mg | ORAL_TABLET | Freq: Every day | ORAL | 1 refills | Status: AC

## 2024-02-18 MED ORDER — LOSARTAN POTASSIUM-HCTZ 100-25 MG PO TABS: 1.0000 | ORAL_TABLET | Freq: Every day | ORAL | 1 refills | Status: AC

## 2024-02-18 NOTE — Progress Notes (Signed)
 " [facility]  Virtual Visit Note  I connected with [name], on 02/18/2024 at 4:32 PM through an audio and video application and verified that I am speaking with the correct person using two identifiers.   Consent: I discussed the limitations, risks, security and privacy concerns of performing an evaluation and management service by mychart and the availability of in person appointments. I also discussed with the patient that there may be a patient responsible charge related to this service. The patient expressed understanding and agreed to proceed.   Location of Patient: Home   Location of Provider:  Primary Care at [facility]   Persons participating in visit: Allye A Boster Laurie Bohr,  NP   History of Present Illness: Laurie Buckley is a 59 year old female Vaginal polyp removed December 16 , 2024 S/P hysterectomy. 02/12/23 . December 15 brother passed rescheduled for mammogram required future testing left breast fatty tissue. MRI on liver showed cyst.(Benign) . Today she is feeling better pain is easing off.  Bp today 128/78    Past Medical History:  Diagnosis Date   Diabetes mellitus without complication (HCC)    GERD (gastroesophageal reflux disease)    with certain foods   Hypertension    on meds   Sleep apnea    sometimes uses CPAP - claustrophobic with mask   Allergies[1]  Medications Ordered Prior to Encounter[2]  Observations/Objective:   Assessment and Plan: Diagnoses and all orders for this visit:  Cough, unspecified type -     albuterol  (VENTOLIN  HFA) 108 (90 Base) MCG/ACT inhaler; Inhale 1 puff into the lungs every 6 (six) hours as needed for wheezing or shortness of breath.  Medication refill -     albuterol  (VENTOLIN  HFA) 108 (90 Base) MCG/ACT inhaler; Inhale 1 puff into the lungs every 6 (six) hours as needed for wheezing or shortness of breath. -     amLODipine  (NORVASC ) 10 MG tablet; Take  1 tablet (10 mg total) by mouth daily. -     losartan -hydrochlorothiazide (HYZAAR) 100-25 MG tablet; Take 1 tablet by mouth daily. -     metFORMIN  (GLUCOPHAGE ) 1000 MG tablet; Take 1 tablet (1,000 mg total) by mouth 2 (two) times daily with a meal.  Essential hypertension -     amLODipine  (NORVASC ) 10 MG tablet; Take 1 tablet (10 mg total) by mouth daily. -     losartan -hydrochlorothiazide (HYZAAR) 100-25 MG tablet; Take 1 tablet by mouth daily.  type 2 diabetes -     metFORMIN  (GLUCOPHAGE ) 1000 MG tablet; Take 1 tablet (1,000 mg total) by mouth 2 (two) times daily with a meal.     Follow Up Instructions: After d/c from Dr. Arlee care   I discussed the assessment and treatment plan with the patient. The patient was provided an opportunity to ask questions and all were answered. The patient agreed with the plan and demonstrated an understanding of the instructions.   The patient was advised to call back or seek an in-person evaluation if the symptoms worsen or if the condition fails to improve as anticipated.     I provided 28 minutes total time during this encounter including median intraservice time, reviewing previous notes, investigations, ordering medications, medical decision making, coordinating care and patient verbalized understanding at the end of the visit.    This note has been created with Education officer, environmental. Any transcriptional errors are unintentional.   Laurie SHAUNNA Bohr, NP  02/18/2024, 4:32 PM      [1] No Known Allergies [2]  Current Outpatient Medications on File Prior to Visit  Medication Sig Dispense Refill   albuterol  (VENTOLIN  HFA) 108 (90 Base) MCG/ACT inhaler Inhale 1 puff into the lungs every 6 (six) hours as needed for wheezing or shortness of breath. 8 g 0   amLODipine  (NORVASC ) 10 MG tablet Take 1 tablet (10 mg total) by mouth daily. 90 tablet 1   benzonatate  (TESSALON ) 200 MG capsule Take 1 capsule (200  mg total) by mouth 2 (two) times daily as needed for cough. 30 capsule 0   ibuprofen  (ADVIL ) 800 MG tablet Take 1 tablet (800 mg total) by mouth every 8 (eight) hours as needed. 30 tablet 0   loratadine  (CLARITIN ) 10 MG tablet Take 1 tablet (10 mg total) by mouth daily. (Patient not taking: Reported on 01/22/2024) 30 tablet 1   losartan -hydrochlorothiazide (HYZAAR) 100-25 MG tablet Take 1 tablet by mouth daily. 90 tablet 1   metFORMIN  (GLUCOPHAGE ) 1000 MG tablet Take 1 tablet (1,000 mg total) by mouth 2 (two) times daily with a meal. 180 tablet 1   [DISCONTINUED] LISINOPRIL PO Take by mouth.     No current facility-administered medications on file prior to visit.   "
